# Patient Record
Sex: Male | Born: 1945 | Race: Black or African American | Hispanic: No | Marital: Single | Smoking: Former smoker
Health system: Southern US, Community
[De-identification: ages and names within clinical notes are randomized; demographics above are authoritative.]

## PROBLEM LIST (undated history)

## (undated) DIAGNOSIS — I10 Essential (primary) hypertension: Secondary | ICD-10-CM

## (undated) DIAGNOSIS — R000 Tachycardia, unspecified: Secondary | ICD-10-CM

## (undated) DIAGNOSIS — Z923 Personal history of irradiation: Secondary | ICD-10-CM

## (undated) DIAGNOSIS — I214 Non-ST elevation (NSTEMI) myocardial infarction: Secondary | ICD-10-CM

## (undated) DIAGNOSIS — R002 Palpitations: Secondary | ICD-10-CM

## (undated) DIAGNOSIS — Z9289 Personal history of other medical treatment: Secondary | ICD-10-CM

## (undated) DIAGNOSIS — J449 Chronic obstructive pulmonary disease, unspecified: Secondary | ICD-10-CM

## (undated) DIAGNOSIS — C3490 Malignant neoplasm of unspecified part of unspecified bronchus or lung: Secondary | ICD-10-CM

## (undated) DIAGNOSIS — R0602 Shortness of breath: Secondary | ICD-10-CM

## (undated) DIAGNOSIS — R0902 Hypoxemia: Secondary | ICD-10-CM

## (undated) HISTORY — DX: Chronic obstructive pulmonary disease, unspecified: J44.9

## (undated) HISTORY — DX: Hypoxemia: R09.02

## (undated) HISTORY — DX: Tachycardia, unspecified: R00.0

## (undated) HISTORY — DX: Non-ST elevation (NSTEMI) myocardial infarction: I21.4

## (undated) HISTORY — PX: OTHER SURGICAL HISTORY: SHX169

## (undated) HISTORY — DX: Palpitations: R00.2

## (undated) HISTORY — DX: Essential (primary) hypertension: I10

---

## 2011-05-25 DIAGNOSIS — Z9289 Personal history of other medical treatment: Secondary | ICD-10-CM

## 2011-05-25 HISTORY — DX: Personal history of other medical treatment: Z92.89

## 2012-05-27 DIAGNOSIS — R0602 Shortness of breath: Secondary | ICD-10-CM

## 2012-07-23 ENCOUNTER — Encounter: Payer: Self-pay | Admitting: Physician Assistant

## 2012-07-24 ENCOUNTER — Encounter: Payer: Self-pay | Admitting: Physician Assistant

## 2012-07-24 DIAGNOSIS — I214 Non-ST elevation (NSTEMI) myocardial infarction: Secondary | ICD-10-CM

## 2012-07-24 DIAGNOSIS — I471 Supraventricular tachycardia: Secondary | ICD-10-CM

## 2012-07-24 DIAGNOSIS — R7989 Other specified abnormal findings of blood chemistry: Secondary | ICD-10-CM

## 2012-07-26 ENCOUNTER — Other Ambulatory Visit: Payer: Self-pay | Admitting: *Deleted

## 2012-07-26 DIAGNOSIS — R000 Tachycardia, unspecified: Secondary | ICD-10-CM

## 2012-07-31 DIAGNOSIS — I2510 Atherosclerotic heart disease of native coronary artery without angina pectoris: Secondary | ICD-10-CM

## 2012-08-02 DIAGNOSIS — R000 Tachycardia, unspecified: Secondary | ICD-10-CM

## 2012-08-04 ENCOUNTER — Encounter: Payer: Self-pay | Admitting: Cardiology

## 2012-08-11 ENCOUNTER — Telehealth: Payer: Self-pay | Admitting: *Deleted

## 2012-08-11 NOTE — Telephone Encounter (Signed)
Message copied by Eustace Moore on Fri Aug 11, 2012  4:53 PM ------      Message from: Jonelle Sidle      Created: Fri Aug 04, 2012 11:31 AM       Reviewed report. Overall low risk study, no clear evidence of ischemia. Please ensure that the patient has a scheduled office visit. He was seen as an inpatient consult, should also have an event monitor already arranged. Please pull the cardiology consultation from [facility] and have it scanned into EPIC. ------

## 2012-08-11 NOTE — Telephone Encounter (Signed)
Patient informed. 

## 2012-08-16 ENCOUNTER — Encounter: Payer: Self-pay | Admitting: Cardiology

## 2012-08-23 ENCOUNTER — Ambulatory Visit (INDEPENDENT_AMBULATORY_CARE_PROVIDER_SITE_OTHER): Payer: Medicare PPO | Admitting: Physician Assistant

## 2012-08-23 ENCOUNTER — Encounter: Payer: Self-pay | Admitting: Physician Assistant

## 2012-08-23 VITALS — BP 134/80 | HR 88 | Ht 68.0 in | Wt 144.4 lb

## 2012-08-23 DIAGNOSIS — I10 Essential (primary) hypertension: Secondary | ICD-10-CM

## 2012-08-23 DIAGNOSIS — I214 Non-ST elevation (NSTEMI) myocardial infarction: Secondary | ICD-10-CM | POA: Insufficient documentation

## 2012-08-23 DIAGNOSIS — I498 Other specified cardiac arrhythmias: Secondary | ICD-10-CM

## 2012-08-23 DIAGNOSIS — R222 Localized swelling, mass and lump, trunk: Secondary | ICD-10-CM

## 2012-08-23 DIAGNOSIS — R000 Tachycardia, unspecified: Secondary | ICD-10-CM | POA: Insufficient documentation

## 2012-08-23 DIAGNOSIS — R918 Other nonspecific abnormal finding of lung field: Secondary | ICD-10-CM

## 2012-08-23 MED ORDER — METOPROLOL TARTRATE 25 MG PO TABS: 25.0000 mg | ORAL_TABLET | Freq: Two times a day (BID) | ORAL | Status: DC

## 2012-08-23 NOTE — Assessment & Plan Note (Signed)
Stable on current medication therapy, which includes recent addition of both amlodipine and Lopressor.

## 2012-08-23 NOTE — Patient Instructions (Signed)
Continue all current medications. Follow up in  3 months 

## 2012-08-23 NOTE — Assessment & Plan Note (Signed)
Quiescent following addition of beta blocker therapy. No recurrent symptoms. No definite evidence of dysrhythmia by recent event monitoring. Continue to monitor clinically.

## 2012-08-23 NOTE — Progress Notes (Signed)
Primary Cardiologist: Simona Huh, MD (new)   HPI: Post hospital followup from [facility], following presentation with SVT. He presented with no known history of heart disease, or prior ischemic evaluation.  She presented to the ED with HR 147, and was treated with 6 mg IV adenosine. Following admission, he demonstrated persistent sinus tachycardia, with no definite evidence of a dysrhythmia. Of note, he presented with complaint of long-standing palpitations.  Cardiac markers suggested NST EMI (type II), in setting of persistent ST. He was also diagnosed with new onset HTN. TSH NL. An echocardiogram was obtained, as follows:   - Echocardiogram, March 3: EF 50-55%, diastolic dysfunction, inferobasal HK; no significant valvular abnormalities  We started patient on ASA and low dose Lopressor, and recommended further evaluation as an outpatient with a 21 day event monitor and an exercise stress Cardiolite study, for risk stratification.   - Event monitor: NSR, no definite dysrhythmia   - Lexiscan Cardiolite, March 10: Question of very small area of scar in inferior septal segment; no definite ischemia; EF 52%; representing a low risk scan  Clinically, he denies any recurrent tachycardia palpitations. He continues to report no exertional CP.  No Known Allergies  Current Outpatient Prescriptions  Medication Sig Dispense Refill  . amLODipine (NORVASC) 5 MG tablet Take 5 mg by mouth daily.       Marland Kitchen aspirin 81 MG chewable tablet Chew 81 mg by mouth daily.      . budesonide-formoterol (SYMBICORT) 160-4.5 MCG/ACT inhaler Inhale 2 puffs into the lungs 2 (two) times daily.      Marland Kitchen ipratropium-albuterol (DUONEB) 0.5-2.5 (3) MG/3ML SOLN Take 3 mLs by nebulization every 4 (four) hours.      . metoprolol tartrate (LOPRESSOR) 25 MG tablet Take 25 mg by mouth daily.       . montelukast (SINGULAIR) 10 MG tablet Take 10 mg by mouth at bedtime.      Docia Barrier IN Inhale into the lungs at bedtime as needed.       . theophylline (THEODUR) 200 MG 12 hr tablet Take 200 mg by mouth 2 (two) times daily.      . VENTOLIN HFA 108 (90 BASE) MCG/ACT inhaler Inhale 2 puffs into the lungs every 6 (six) hours as needed.        No current facility-administered medications for this visit.    Past Medical History  Diagnosis Date  . COPD (chronic obstructive pulmonary disease)     History of tobacco use  . Hypoxic     History of hypoxic respiratory failure  . Sinus tachycardia   . Palpitations   . Non-ST elevated myocardial infarction (non-STEMI)   . Hypertension     No past surgical history on file.  History   Social History  . Marital Status: Single    Spouse Name: N/A    Number of Children: N/A  . Years of Education: N/A   Occupational History  . Not on file.   Social History Main Topics  . Smoking status: Former Smoker -- 1.00 packs/day for 30 years    Types: Cigarettes    Quit date: 05/24/2006  . Smokeless tobacco: Not on file  . Alcohol Use: No  . Drug Use: Not on file  . Sexually Active: Not on file   Other Topics Concern  . Not on file   Social History Narrative  . No narrative on file    Family History  Problem Relation Age of Onset  . Heart attack Brother  cause of death at age 37  . Heart attack Brother     cause of death at age 5    ROS: no nausea, vomiting; no fever, chills; no melena, hematochezia; no claudication  PHYSICAL EXAM: BP 134/80  Pulse 88  Ht 5\' 8"  (1.727 m)  Wt 144 lb 6.4 oz (65.499 kg)  BMI 21.96 kg/m2 GENERAL: 67 year old male; NAD HEENT: NCAT, PERRLA, EOMI; sclera clear; no xanthelasma; nasal cannula NECK: palpable bilateral carotid pulses, no bruits; no JVD; no TM LUNGS: Diminished breath sounds, crackles/wheezes CARDIAC: RRR (S1, S2); no significant murmurs; no rubs or gallops ABDOMEN: soft, non-tender; intact BS EXTREMETIES: intact distal pulses; no significant peripheral edema SKIN: warm/dry; no obvious rash/lesions MUSCULOSKELETAL:  no joint deformity NEURO: no focal deficit; NL affect   EKG:    ASSESSMENT & PLAN:  Sinus tachycardia Quiescent following addition of beta blocker therapy. No recurrent symptoms. No definite evidence of dysrhythmia by recent event monitoring. Continue to monitor clinically.  Non-ST elevated myocardial infarction (non-STEMI) No further workup currently indicated. Patient had minimal elevation of troponins (0.08) in the setting of tachycardia. He denies any history of exertional CP. His recent nuclear imaging study was deemed a low risk scan, by Dr. Myrtis Ser, with no definite evidence of ischemia, and question of very small area of scar in the inferoseptal region. Continue to monitor clinically on low-dose ASA and beta blocker. Of note, no current indication to add a statin, given HDL 75.   Pulmonary mass Patient is scheduled to follow up with Dr. Cherie Ouch.  Hypertension Stable on current medication therapy, which includes recent addition of both amlodipine and Lopressor.    Gene Syana Degraffenreid, PAC

## 2012-08-23 NOTE — Assessment & Plan Note (Signed)
Patient is scheduled to follow up with Dr. Cherie Ouch.

## 2012-08-23 NOTE — Assessment & Plan Note (Signed)
No further workup currently indicated. Patient had minimal elevation of troponins (0.08) in the setting of tachycardia. He denies any history of exertional CP. His recent nuclear imaging study was deemed a low risk scan, by Dr. Myrtis Ser, with no definite evidence of ischemia, and question of very small area of scar in the inferoseptal region. Continue to monitor clinically on low-dose ASA and beta blocker. Of note, no current indication to add a statin, given HDL 75.

## 2012-09-22 ENCOUNTER — Telehealth: Payer: Self-pay | Admitting: *Deleted

## 2012-09-22 MED ORDER — NITROGLYCERIN 0.4 MG SL SUBL: 0.4000 mg | SUBLINGUAL_TABLET | SUBLINGUAL | Status: AC | PRN

## 2012-09-22 NOTE — Telephone Encounter (Signed)
Chart reviewed. Recommend adding NTG 0.4 mg prn for CP, and to notify our office if sxs worsen, or do not respond to NTG.

## 2012-09-22 NOTE — Telephone Encounter (Signed)
Patient notified.  Will send new rx to [facility] [place].  Informed patient on proper usage of the Nitroglycerin and to go to ED if necessary.  Patient verbalized understanding.

## 2012-09-22 NOTE — Telephone Encounter (Signed)
HH nurse called from patient home this morning.  States patient has been c/o intermittant chest pain (0-5) x last 3 days.  Vitals are stable with BP of 124/86.  No other c/o any other symptoms.  Does not have NTG at home.  Also, is not c/o of any active chest pain now.  Does have OV to see Dr. Orson Aloe next week.  Advised [facility] nurse that info will be sent to PA for advice.  Patient last seen 4/2.

## 2013-03-12 ENCOUNTER — Encounter (HOSPITAL_COMMUNITY): Payer: Self-pay | Admitting: Pharmacy Technician

## 2013-03-15 ENCOUNTER — Encounter (HOSPITAL_COMMUNITY)
Admission: RE | Admit: 2013-03-15 | Discharge: 2013-03-15 | Disposition: A | Discharge status: Home / Self Care | Payer: Medicare PPO | Source: Ambulatory Visit | Attending: Oral Surgery | Admitting: Oral Surgery

## 2013-03-15 ENCOUNTER — Encounter (HOSPITAL_COMMUNITY): Payer: Self-pay

## 2013-03-15 ENCOUNTER — Other Ambulatory Visit (HOSPITAL_COMMUNITY): Payer: Self-pay | Admitting: *Deleted

## 2013-03-15 DIAGNOSIS — Z01818 Encounter for other preprocedural examination: Secondary | ICD-10-CM | POA: Insufficient documentation

## 2013-03-15 DIAGNOSIS — Z01812 Encounter for preprocedural laboratory examination: Secondary | ICD-10-CM | POA: Insufficient documentation

## 2013-03-15 HISTORY — DX: Personal history of other medical treatment: Z92.89

## 2013-03-15 HISTORY — DX: Shortness of breath: R06.02

## 2013-03-15 LAB — BASIC METABOLIC PANEL
BUN: 9 mg/dL (ref 6–23)
CO2: 26 mEq/L (ref 19–32)
Calcium: 10 mg/dL (ref 8.4–10.5)
Glucose, Bld: 100 mg/dL — ABNORMAL HIGH (ref 70–99)
Potassium: 4 mEq/L (ref 3.5–5.1)
Sodium: 138 mEq/L (ref 135–145)

## 2013-03-15 LAB — CBC
Hemoglobin: 15.5 g/dL (ref 13.0–17.0)
MCH: 33 pg (ref 26.0–34.0)
MCHC: 35.5 g/dL (ref 30.0–36.0)
MCV: 93 fL (ref 78.0–100.0)
RBC: 4.7 MIL/uL (ref 4.22–5.81)

## 2013-03-15 NOTE — H&P (Signed)
HISTORY AND PHYSICAL  Daniel Hooper is a 67 y.o. male patient with CC: painful teeth  No diagnosis found.  Past Medical History  Diagnosis Date  . Hypoxic     History of hypoxic respiratory failure  . Sinus tachycardia   . Palpitations   . Non-ST elevated myocardial infarction (non-STEMI)   . Hypertension   . COPD (chronic obstructive pulmonary disease)     History of tobacco use, O2 - 2 liters per min., uses on & off   . Shortness of breath   . H/O echocardiogram 2013    No current facility-administered medications for this encounter.   Current Outpatient Prescriptions  Medication Sig Dispense Refill  . amLODipine (NORVASC) 5 MG tablet Take 5 mg by mouth daily before breakfast.       . aspirin 81 MG chewable tablet Chew 81 mg by mouth daily.      . budesonide-formoterol (SYMBICORT) 160-4.5 MCG/ACT inhaler Inhale 2 puffs into the lungs 2 (two) times daily.      Marland Kitchen ipratropium-albuterol (DUONEB) 0.5-2.5 (3) MG/3ML SOLN Take 3 mLs by nebulization every 6 (six) hours.       . montelukast (SINGULAIR) 10 MG tablet Take 10 mg by mouth at bedtime.      . nitroGLYCERIN (NITROSTAT) 0.4 MG SL tablet Place 1 tablet (0.4 mg total) under the tongue every 5 (five) minutes as needed for chest pain. (Not more than 3 in 15 minute time frame, if no relief - proceed to ED)  25 tablet  3  . theophylline (THEODUR) 200 MG 12 hr tablet Take 200 mg by mouth 2 (two) times daily.      . VENTOLIN HFA 108 (90 BASE) MCG/ACT inhaler Inhale 2 puffs into the lungs every 6 (six) hours as needed for shortness of breath.       Docia Barrier IN Inhale into the lungs at bedtime as needed.       No Known Allergies Active Problems:   * No active hospital problems. *  Vitals: There were no vitals taken for this visit. Lab results: Results for orders placed during the hospital encounter of 03/15/13 (from the past 24 hour(s))  BASIC METABOLIC PANEL     Status: Abnormal   Collection Time    03/15/13 12:58 PM   Result Value Range   Sodium 138  135 - 145 mEq/L   Potassium 4.0  3.5 - 5.1 mEq/L   Chloride 98  96 - 112 mEq/L   CO2 26  19 - 32 mEq/L   Glucose, Bld 100 (*) 70 - 99 mg/dL   BUN 9  6 - 23 mg/dL   Creatinine, Ser 4.09  0.50 - 1.35 mg/dL   Calcium 81.1  8.4 - 91.4 mg/dL   GFR calc non Af Amer >90  >90 mL/min   GFR calc Af Amer >90  >90 mL/min  CBC     Status: None   Collection Time    03/15/13 12:58 PM      Result Value Range   WBC 7.8  4.0 - 10.5 K/uL   RBC 4.70  4.22 - 5.81 MIL/uL   Hemoglobin 15.5  13.0 - 17.0 g/dL   HCT 78.2  95.6 - 21.3 %   MCV 93.0  78.0 - 100.0 fL   MCH 33.0  26.0 - 34.0 pg   MCHC 35.5  30.0 - 36.0 g/dL   RDW 08.6  57.8 - 46.9 %   Platelets 278  150 - 400 K/uL  Radiology Results: Dg Chest 2 View  03/15/2013   CLINICAL DATA:  Hypertension, preop.  EXAM: CHEST  2 VIEW  COMPARISON:  07/23/2012  FINDINGS: There is hyperinflation of the lungs compatible with COPD. scarring in the right upper lobe peripherally, stable. Left lung is clear. Heart is normal size. No effusions or acute bony abnormality.  IMPRESSION: COPD/chronic changes. No active disease.   Electronically Signed   By: Charlett Nose M.D.   On: 03/15/2013 14:14   General appearance: alert, cooperative and no distress Head: Normocephalic, without obvious abnormality, atraumatic Eyes: negative Ears: normal TM's and external ear canals both ears Nose: Nares normal. Septum midline. Mucosa normal. No drainage or sinus tenderness. Throat: dental caries teeth # 8, 9, 11, 13, 14, 15, 16, 18, 19, 20, 21, 23, 24, 25, 26, 27, 28, 29, 30, bilateral mandibular tori. Neck: no adenopathy, no carotid bruit, supple, symmetrical, trachea midline and thyroid not enlarged, symmetric, no tenderness/mass/nodules Resp: clear to auscultation bilaterally Cardio: regular rate and rhythm, S1, S2 normal, no murmur, click, rub or gallop  Assessment: Non-restorableteeth # 8, 9, 11, 13, 14, 15, 16, 18, 19, 20, 21, 23, 24, 25, 26,  27, 28, 29, 30, bilateral mandibular tori.   Plan:Extraction teeth # 8, 9, 11, 13, 14, 15, 16, 18, 19, 20, 21, 23, 24, 25, 26, 27, 28, 29, 30, alveoloplasty, removal  bilateral mandibular tori. General anesthesia, day surgery.   Daniel Hooper 03/15/2013

## 2013-03-15 NOTE — Pre-Procedure Instructions (Signed)
Daniel Hooper  03/15/2013   Your procedure is scheduled on:  Tuesday, March 20, 2013 at 11:30 AM.   Report to [facility] Entrance "A" at 8:30 AM.   Call this number if you have problems the morning of surgery: [phone]   Remember:   Do not eat food or drink liquids after midnight Monday, 03/19/13.   Take these medicines the morning of surgery with A SIP OF WATER: amLODipine (NORVASC), budesonide-formoterol (SYMBICORT) inhaler, VENTOLIN HFA 108 (90 BASE) inhaler - if needed, nitroGLYCERIN (NITROSTAT) - if needed.  Stop all Vitamins, Herbal Medications, Aspirin and NSAIDS as of today, 03/15/13.       Do not wear jewelry.  Do not wear lotions, powders, or cologne. You may wear deodorant.  Do not shave 48 hours prior to surgery. Men may shave face and neck.  Do not bring valuables to the hospital.  [facility] is not responsible                  for any belongings or valuables.               Contacts, dentures or bridgework may not be worn into surgery.  Leave suitcase in the car. After surgery it may be brought to your room.  For patients admitted to the hospital, discharge time is determined by your                treatment team.               Patients discharged the day of surgery will not be allowed to drive  home.  Name and phone number of your driver: Family/friend   Special Instructions: Shower using CHG 2 nights before surgery and the night before surgery.  If you shower the day of surgery use CHG.  Use special wash - you have one bottle of CHG for all showers.  You should use approximately 1/3 of the bottle for each shower.   Please read over the following fact sheets that you were given: Pain Booklet, Coughing and Deep Breathing and Surgical Site Infection Prevention

## 2013-03-15 NOTE — Progress Notes (Signed)
Call to Dr. Barbette Merino office, spoke with Ocie Cornfield, requested medical clearance fr. Pulm./internal med. Review.

## 2013-03-16 NOTE — Progress Notes (Signed)
Anesthesia Chart Review:  Patient is a 67 year old male scheduled for multiple teeth extractions on 03/20/13 by Dr. Barbette Merino.  History includes former smoker, NSTEMI (minimal elevation of troponin at 0.08 in the setting of tachycardia/SVT--demand ischemia) with subsequent echo and low risk stress test 07/2012 and no further cardiac testing was felt indicated at that time, HTN, COPD with PRN O2 @ 2L/[place], RUL lung mass with 01/12/2013 pulmonology notes mention consideration of referral to Dr. Tyrone Sage for resection. Cardiologist is Dr. Diona Browner with last visit with Gene Serpe, PA-C 08/23/2012.  Pulmonologist is Dr. Cherie Ouch who cleared patient for this procedure from a pulmonary standpoint following a visit on 01/12/13.    EKG on 07/24/12 showed SR, abnormal R wave progression, early transition, minimal anterior ST elevation.  Echo on 07/24/12 showed mild concentric LVH, global LV wall motion and contractility are WNL, EF 50-55%, abnormal LV diastolic filling is observed, consistent with impaired relaxation.  The basal inferior wall segment is hypokinetic (score 2). Trace MR/TR.  Nuclear stress test on 07/31/12 showed a question of a very small area of scar with no ischemia in the inferior septal segment, EF 52%.  Ultimately, felt to be a low risk scan.     Cardiac event monitor read on 08/23/12 showed SR with no definite arrythmia.   CXR on 03/15/13 showed COPD/chronic changes. No active disease.   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   Preoperative labs noted.  I will request PFT's from Dr. Orson Aloe and more recent records (if any).  Patient was cleared by pulmonology.  He has had cardiac evaluation approximately six months ago with stress and echo earlier this year with no additional testing recommended at that time.  He will  be further evaluated by his assigned anesthesiologist, but if no acute changes in his cardiopulmonary status then I would anticipate that he could proceed as planned.  Velna Ochs [facility] Short Stay Center/Anesthesiology Phone [phone] 03/16/2013 2:00 PM

## 2013-03-19 MED ORDER — CEFAZOLIN SODIUM-DEXTROSE 2-3 GM-% IV SOLR
2.0000 g | INTRAVENOUS | Status: AC
  Administered 2013-03-20: 2 g via INTRAVENOUS
  Filled 2013-03-19: qty 50

## 2013-03-19 NOTE — Progress Notes (Signed)
Pt notified of time change;to arrive at 0730

## 2013-03-20 ENCOUNTER — Encounter (HOSPITAL_COMMUNITY): Payer: Medicare PPO | Admitting: Vascular Surgery

## 2013-03-20 ENCOUNTER — Encounter (HOSPITAL_COMMUNITY)
Admission: RE | Disposition: A | Discharge status: Home / Self Care | Payer: Self-pay | Source: Ambulatory Visit | Attending: Oral Surgery

## 2013-03-20 ENCOUNTER — Encounter (HOSPITAL_COMMUNITY): Payer: Self-pay

## 2013-03-20 ENCOUNTER — Ambulatory Visit (HOSPITAL_COMMUNITY): Payer: Medicare PPO | Admitting: Anesthesiology

## 2013-03-20 ENCOUNTER — Ambulatory Visit (HOSPITAL_COMMUNITY)
Admission: RE | Admit: 2013-03-20 | Discharge: 2013-03-20 | Disposition: A | Discharge status: Home / Self Care | Payer: Medicare PPO | Source: Ambulatory Visit | Attending: Oral Surgery | Admitting: Oral Surgery

## 2013-03-20 DIAGNOSIS — R918 Other nonspecific abnormal finding of lung field: Secondary | ICD-10-CM

## 2013-03-20 DIAGNOSIS — R000 Tachycardia, unspecified: Secondary | ICD-10-CM

## 2013-03-20 DIAGNOSIS — Z7982 Long term (current) use of aspirin: Secondary | ICD-10-CM | POA: Insufficient documentation

## 2013-03-20 DIAGNOSIS — Z79899 Other long term (current) drug therapy: Secondary | ICD-10-CM | POA: Insufficient documentation

## 2013-03-20 DIAGNOSIS — J4489 Other specified chronic obstructive pulmonary disease: Secondary | ICD-10-CM | POA: Insufficient documentation

## 2013-03-20 DIAGNOSIS — M278 Other specified diseases of jaws: Secondary | ICD-10-CM | POA: Insufficient documentation

## 2013-03-20 DIAGNOSIS — I252 Old myocardial infarction: Secondary | ICD-10-CM | POA: Insufficient documentation

## 2013-03-20 DIAGNOSIS — I10 Essential (primary) hypertension: Secondary | ICD-10-CM

## 2013-03-20 DIAGNOSIS — I498 Other specified cardiac arrhythmias: Secondary | ICD-10-CM | POA: Insufficient documentation

## 2013-03-20 DIAGNOSIS — I214 Non-ST elevation (NSTEMI) myocardial infarction: Secondary | ICD-10-CM

## 2013-03-20 DIAGNOSIS — K0530 Chronic periodontitis, unspecified: Secondary | ICD-10-CM

## 2013-03-20 DIAGNOSIS — K029 Dental caries, unspecified: Secondary | ICD-10-CM

## 2013-03-20 DIAGNOSIS — J449 Chronic obstructive pulmonary disease, unspecified: Secondary | ICD-10-CM | POA: Insufficient documentation

## 2013-03-20 DIAGNOSIS — R002 Palpitations: Secondary | ICD-10-CM | POA: Insufficient documentation

## 2013-03-20 HISTORY — PX: MULTIPLE EXTRACTIONS WITH ALVEOLOPLASTY: SHX5342

## 2013-03-20 SURGERY — MULTIPLE EXTRACTION WITH ALVEOLOPLASTY
Anesthesia: General | Site: Mouth | Wound class: Clean Contaminated

## 2013-03-20 MED ORDER — SUCCINYLCHOLINE CHLORIDE 20 MG/ML IJ SOLN
INTRAMUSCULAR | Status: DC | PRN
  Administered 2013-03-20: 80 mg via INTRAVENOUS

## 2013-03-20 MED ORDER — OXYMETAZOLINE HCL 0.05 % NA SOLN
NASAL | Status: DC | PRN
  Administered 2013-03-20: 1 via NASAL

## 2013-03-20 MED ORDER — ONDANSETRON HCL 4 MG/2ML IJ SOLN: 4.0000 mg | Freq: Once | INTRAMUSCULAR | Status: DC | PRN

## 2013-03-20 MED ORDER — HYDROMORPHONE HCL PF 1 MG/ML IJ SOLN
INTRAMUSCULAR | Status: AC
  Filled 2013-03-20: qty 1

## 2013-03-20 MED ORDER — LIDOCAINE HCL (CARDIAC) 20 MG/ML IV SOLN
INTRAVENOUS | Status: DC | PRN
  Administered 2013-03-20: 40 mg via INTRAVENOUS

## 2013-03-20 MED ORDER — PROPOFOL 10 MG/ML IV BOLUS
INTRAVENOUS | Status: DC | PRN
  Administered 2013-03-20: 30 mg via INTRAVENOUS
  Administered 2013-03-20: 170 mg via INTRAVENOUS

## 2013-03-20 MED ORDER — LIDOCAINE-EPINEPHRINE 2 %-1:100000 IJ SOLN
INTRAMUSCULAR | Status: DC | PRN
  Administered 2013-03-20: 16 mL

## 2013-03-20 MED ORDER — SODIUM CHLORIDE 0.9 % IR SOLN
Status: DC | PRN
  Administered 2013-03-20: 1000 mL

## 2013-03-20 MED ORDER — ONDANSETRON HCL 4 MG/2ML IJ SOLN
INTRAMUSCULAR | Status: DC | PRN
  Administered 2013-03-20: 4 mg via INTRAVENOUS

## 2013-03-20 MED ORDER — LACTATED RINGERS IV SOLN
INTRAVENOUS | Status: DC
  Administered 2013-03-20: 10:00:00 via INTRAVENOUS

## 2013-03-20 MED ORDER — ARTIFICIAL TEARS OP OINT
TOPICAL_OINTMENT | OPHTHALMIC | Status: DC | PRN
  Administered 2013-03-20: 1 via OPHTHALMIC

## 2013-03-20 MED ORDER — OXYCODONE-ACETAMINOPHEN 5-325 MG PO TABS: 1.0000 | ORAL_TABLET | ORAL | Status: DC | PRN

## 2013-03-20 MED ORDER — FENTANYL CITRATE 0.05 MG/ML IJ SOLN
INTRAMUSCULAR | Status: DC | PRN
  Administered 2013-03-20: 100 ug via INTRAVENOUS
  Administered 2013-03-20 (×2): 50 ug via INTRAVENOUS

## 2013-03-20 MED ORDER — HYDROMORPHONE HCL PF 1 MG/ML IJ SOLN
0.2500 mg | INTRAMUSCULAR | Status: DC | PRN
  Administered 2013-03-20: 0.5 mg via INTRAVENOUS

## 2013-03-20 MED ORDER — LIDOCAINE-EPINEPHRINE 2 %-1:100000 IJ SOLN
INTRAMUSCULAR | Status: AC
  Filled 2013-03-20: qty 1

## 2013-03-20 MED ORDER — MIDAZOLAM HCL 5 MG/5ML IJ SOLN
INTRAMUSCULAR | Status: DC | PRN
  Administered 2013-03-20: 2 mg via INTRAVENOUS

## 2013-03-20 SURGICAL SUPPLY — 27 items
BUR CROSS CUT FISSURE 1.6 (BURR) ×2 IMPLANT
BUR EGG ELITE 4.0 (BURR) ×2 IMPLANT
CANISTER SUCTION 2500CC (MISCELLANEOUS) ×2 IMPLANT
CLOTH BEACON ORANGE TIMEOUT ST (SAFETY) ×2 IMPLANT
COVER SURGICAL LIGHT HANDLE (MISCELLANEOUS) ×2 IMPLANT
CRADLE DONUT ADULT HEAD (MISCELLANEOUS) ×2 IMPLANT
DECANTER SPIKE VIAL GLASS SM (MISCELLANEOUS) ×2 IMPLANT
GAUZE PACKING FOLDED 2  STR (GAUZE/BANDAGES/DRESSINGS) ×1
GAUZE PACKING FOLDED 2 STR (GAUZE/BANDAGES/DRESSINGS) ×1 IMPLANT
GLOVE BIO SURGEON STRL SZ 6.5 (GLOVE) ×2 IMPLANT
GLOVE BIO SURGEON STRL SZ7.5 (GLOVE) ×2 IMPLANT
GLOVE BIOGEL PI IND STRL 7.0 (GLOVE) ×1 IMPLANT
GLOVE BIOGEL PI INDICATOR 7.0 (GLOVE) ×1
GOWN STRL NON-REIN LRG LVL3 (GOWN DISPOSABLE) ×2 IMPLANT
GOWN STRL REIN XL XLG (GOWN DISPOSABLE) ×2 IMPLANT
KIT BASIN OR (CUSTOM PROCEDURE TRAY) ×2 IMPLANT
KIT ROOM TURNOVER OR (KITS) ×2 IMPLANT
NEEDLE 22X1 1/2 (OR ONLY) (NEEDLE) ×2 IMPLANT
NS IRRIG 1000ML POUR BTL (IV SOLUTION) ×2 IMPLANT
PAD ARMBOARD 7.5X6 YLW CONV (MISCELLANEOUS) ×4 IMPLANT
SUT CHROMIC 3 0 PS 2 (SUTURE) ×2 IMPLANT
SYR CONTROL 10ML LL (SYRINGE) ×2 IMPLANT
TOWEL OR 17X26 10 PK STRL BLUE (TOWEL DISPOSABLE) ×2 IMPLANT
TRAY ENT MC OR (CUSTOM PROCEDURE TRAY) ×2 IMPLANT
TUBING IRRIGATION (MISCELLANEOUS) ×2 IMPLANT
WATER STERILE IRR 1000ML POUR (IV SOLUTION) IMPLANT
YANKAUER SUCT BULB TIP NO VENT (SUCTIONS) ×2 IMPLANT

## 2013-03-20 NOTE — Anesthesia Procedure Notes (Signed)
Procedure Name: Intubation Date/Time: 03/20/2013 11:47 AM Performed by: De Nurse Pre-anesthesia Checklist: Patient identified, Emergency Drugs available, Suction available, Patient being monitored and Timeout performed Patient Re-evaluated:Patient Re-evaluated prior to inductionOxygen Delivery Method: Circle system utilized Preoxygenation: Pre-oxygenation with 100% oxygen Intubation Type: IV induction and Rapid sequence Laryngoscope Size: Mac and 4 Grade View: Grade I Nasal Tubes: Right, Nasal Rae and Nasal prep performed Tube size: 7.0 mm Number of attempts: 1 Placement Confirmation: ETT inserted through vocal cords under direct vision,  positive ETCO2 and breath sounds checked- equal and bilateral Secured at: 28 cm Tube secured with: Tape Dental Injury: Teeth and Oropharynx as per pre-operative assessment

## 2013-03-20 NOTE — Preoperative (Signed)
Beta Blockers   Reason not to administer Beta Blockers:Not Applicable 

## 2013-03-20 NOTE — Anesthesia Postprocedure Evaluation (Signed)
  Anesthesia Post-op Note  Patient: Daniel Hooper  Procedure(s) Performed: Procedure(s): MULTIPLE EXTRACION 8, 9, 11, 13, 14, 15, 16, 18, 19, 20, 21, 23, 24, 25, 26, 27, 28, 29, 30 WITH ALVEOLOPLASTY X 4, BILATERAL TORI (N/A)  Patient Location: PACU  Anesthesia Type:General  Level of Consciousness: awake, alert  and oriented  Airway and Oxygen Therapy: Patient Spontanous Breathing and Patient connected to nasal cannula oxygen  Post-op Pain: mild  Post-op Assessment: Post-op Vital signs reviewed, Patient's Cardiovascular Status Stable, Respiratory Function Stable, Patent Airway and Pain level controlled  Post-op Vital Signs: stable  Complications: No apparent anesthesia complications

## 2013-03-20 NOTE — Transfer of Care (Signed)
Immediate Anesthesia Transfer of Care Note  Patient: Daniel Hooper  Procedure(s) Performed: Procedure(s): MULTIPLE EXTRACION 8, 9, 11, 13, 14, 15, 16, 18, 19, 20, 21, 23, 24, 25, 26, 27, 28, 29, 30 WITH ALVEOLOPLASTY X 4, BILATERAL TORI (N/A)  Patient Location: PACU  Anesthesia Type:General  Level of Consciousness: awake, alert  and oriented  Airway & Oxygen Therapy: Patient Spontanous Breathing and Patient connected to face mask oxygen  Post-op Assessment: Report given to PACU RN  Post vital signs: Reviewed and stable  Complications: No apparent anesthesia complications

## 2013-03-20 NOTE — Op Note (Signed)
03/20/2013  12:31 PM  PATIENT:  Assunta Curtis  67 y.o. male  PRE-OPERATIVE DIAGNOSIS:  NON RESTORABLE TEETH 8, 9, 11, 13, 14, 15, 16, 18, 19, 20, 21, 23, 24, 25, 26, 27, 28, 29, 30 , BILATERAL mandibular lingual TORI  POST-OPERATIVE DIAGNOSIS:  SAME  PROCEDURE:  Procedure(s): MULTIPLE EXTRACION 8, 9, 11, 13, 14, 15, 16, 18, 19, 20, 21, 23, 24, 25, 26, 27, 28, 29, 30 WITH ALVEOLOPLASTY X 4, REMOVAL BILATERAL MANDIBULAR LINGUAL TORI  SURGEON:  Surgeon(s): Georgia Lopes, DDS  ANESTHESIA:   local and general  EBL:  minimal  DRAINS: none   SPECIMEN:  No Specimen  COUNTS:  YES  PLAN OF CARE: Discharge to home after PACU  PATIENT DISPOSITION:  PACU - hemodynamically stable.   PROCEDURE DETAILS: Dictation # [id number]  Georgia Lopes, DMD 03/20/2013 12:31 PM

## 2013-03-20 NOTE — H&P (Signed)
H&P documentation  -History and Physical Reviewed  -Patient has been re-examined  -No change in the plan of care  Daniel Hooper M  

## 2013-03-20 NOTE — Anesthesia Preprocedure Evaluation (Addendum)
Anesthesia Evaluation  Patient identified by MRN, date of birth, ID band Patient awake    Reviewed: Allergy & Precautions, H&P , NPO status , Patient's Chart, lab work & pertinent test results  Airway Mallampati: II TM Distance: >3 FB Neck ROM: Full    Dental  (+) Poor Dentition   Pulmonary  breath sounds clear to auscultation  + decreased breath sounds      Cardiovascular Rhythm:Regular Rate:Normal     Neuro/Psych    GI/Hepatic   Endo/Other    Renal/GU      Musculoskeletal   Abdominal   Peds  Hematology   Anesthesia Other Findings   Reproductive/Obstetrics                           Anesthesia Physical Anesthesia Plan  ASA: III  Anesthesia Plan: General   Post-op Pain Management:    Induction: Intravenous  Airway Management Planned: Nasal ETT  Additional Equipment:   Intra-op Plan:   Post-operative Plan: Extubation in OR  Informed Consent: I have reviewed the patients History and Physical, chart, labs and discussed the procedure including the risks, benefits and alternatives for the proposed anesthesia with the patient or authorized representative who has indicated his/her understanding and acceptance.     Plan Discussed with: CRNA and Anesthesiologist  Anesthesia Plan Comments: (Poor dentition COPD on home O2, breathing now at baseline H/O SVT with mild troponin elevation, low risk nuclear stress test 07/2012   Plan GA with nasal ETT  Kipp Brood, MD)       Anesthesia Quick Evaluation

## 2013-03-21 ENCOUNTER — Encounter (HOSPITAL_COMMUNITY): Payer: Self-pay | Admitting: Oral Surgery

## 2013-03-21 NOTE — Op Note (Signed)
NAME:  Daniel Hooper, AIGNER NO.:  [id number]  MEDICAL RECORD NO.:  [id number]  LOCATION:  [facility]                         FACILITY:  [facility]  PHYSICIAN:  Georgia Lopes, M.D.  DATE OF BIRTH:  1945-08-05  DATE OF PROCEDURE:  03/20/2013 DATE OF DISCHARGE:  03/20/2013                              OPERATIVE REPORT   PREOPERATIVE DIAGNOSIS:  Nonrestorable teeth #8, 9, 11, 13, 14, 15, 16, 18, 19, 20, 21, 23, 24, 25, 26, 27, 28, 29, 30 bilateral mandibular lingual tori.  POSTOPERATIVE DIAGNOSIS:  Nonrestorable teeth #8, 9, 11, 13, 14, 15, 16, 18, 19, 20, 21, 23, 24, 25, 26, 27, 28, 29, 30 bilateral mandibular lingual tori.  PROCEDURE:  Extraction of teeth #8, 9, 11, 13, 14, 15, 16, 18, 19, 20, 21, 23, 24, 25, 26, 27, 28, 29, 30, alveoplasty of the right and left maxilla and mandible, removal of bilateral mandibular lingual tori.  SURGEON:  Georgia Lopes, MD  ANESTHESIA:  General.  INDICATIONS FOR PROCEDURE:  Mr. Delage is a 67 year old male who was referred to my office by his general dentist for removal of all nonrestorable teeth secondary to dental caries.  He has a past medical history complicated by COPD, hypertension, sinus tachycardia, palpitations, history of MI because of the patient's medical condition and the need for adequate anesthesia.  It was recommended that the case be performed under general anesthesia with intubation for airway protection.  PROCEDURE:  The patient was taken to the operating room, placed on the table in supine position.  General anesthesia was administered intravenously and a nasal endotracheal tube was placed and secured.  The eyes were protected, and the patient was draped for surgery.  The time- out was performed and the posterior pharynx was suctioned and the throat pack was placed.  A 2% lidocaine with 1:100,000 epinephrine was infiltrated in the right and left inferior alveolar block and in a mandibular buccal infiltration in the  anterior region and then in the maxilla, buccal, and palatal infiltration was administered.  Total of 14 mL of anesthetic solution was utilized.  A bite block was placed in the right side of the mouth and a sweetheart retractor was used to retract the tongue and the left side was operated first.  A #15 blade was used to make an incision around teeth #18, 19, 20, 21, 23, 24, 25, 26, and 27 on the buccal and lingual aspects.  The periosteum was reflected from around these teeth and then a 301 elevator was used to elevate the teeth.  The posterior teeth were removed from the mouth with the #151 universal forceps and the anterior teeth were removed with the Asch forceps.  Then, the sockets were curetted, and the periosteum was reflected to expose the alveolar crest and the mandibular lingual torus on the left side.  Then, an egg-shaped bur was used to perform the alveoplasty and the removal of the left lingual torus.  A bone file was then used to further smooth the bone, then the area was irrigated and closed with 3-0 chromic.  Then, a 15 blade was used to make an incision beginning at tooth #16 on the  buccal aspects and going anteriorly around teeth #15, 14, 13, 11, 9 and 8.  The periosteum was reflected, buckling and palatally from around these teeth and then the teeth were elevated with a 301 elevator and removed from the mouth with the upper universal forceps.  The sockets were then curetted and then the periosteum was further reflected to expose the alveolar crest on the buccal aspect, and then alveoplasty was performed using the egg-shaped bur and bone file. Then, this area was irrigated and closed with 3-0 chromic.  The bite block and sweetheart retractor were repositioned to the other side of the mouth and the 15 blade was used to make an incision around teeth #27, 28, 29, 30 on the buccal and lingual aspects.  The periosteum was reflected and then a 301 elevator was used to elevate  the teeth and the teeth were removed using the #151 forceps.  The sockets were then curetted and then the periosteum was reflected to expose the alveolar crest as well as the right lingual torus.  The alveoplasty was performed with the egg-shaped bur and bone file and then the torus was removed using the egg-shaped bur and bone file.  Then, this area was irrigated and closed with 3-0 chromic.  The oral cavity was inspected and found to have good contour hemostasis and closure.  The oral cavity was irrigated and suctioned.  The throat pack was removed.  The patient was awakened and taken to the recovery room, breathing spontaneously in good condition.  ESTIMATED BLOOD LOSS:  Minimum.  COMPLICATIONS:  None.  SPECIMENS:  None.     Georgia Lopes, M.D.     SMJ/MEDQ  D:  03/20/2013  T:  03/21/2013  Job:  [id number]

## 2013-05-24 DIAGNOSIS — C3490 Malignant neoplasm of unspecified part of unspecified bronchus or lung: Secondary | ICD-10-CM

## 2013-05-24 HISTORY — DX: Malignant neoplasm of unspecified part of unspecified bronchus or lung: C34.90

## 2013-07-31 ENCOUNTER — Other Ambulatory Visit: Payer: Self-pay | Admitting: Cardiology

## 2013-07-31 MED ORDER — METOPROLOL TARTRATE 25 MG PO TABS: 25.0000 mg | ORAL_TABLET | Freq: Two times a day (BID) | ORAL | Status: DC

## 2013-08-20 ENCOUNTER — Ambulatory Visit (INDEPENDENT_AMBULATORY_CARE_PROVIDER_SITE_OTHER): Payer: Medicare PPO | Admitting: Cardiovascular Disease

## 2013-08-20 ENCOUNTER — Encounter: Payer: Self-pay | Admitting: Cardiovascular Disease

## 2013-08-20 VITALS — BP 151/82 | HR 97 | Ht 68.0 in | Wt 140.0 lb

## 2013-08-20 DIAGNOSIS — R000 Tachycardia, unspecified: Secondary | ICD-10-CM

## 2013-08-20 DIAGNOSIS — R918 Other nonspecific abnormal finding of lung field: Secondary | ICD-10-CM

## 2013-08-20 DIAGNOSIS — I10 Essential (primary) hypertension: Secondary | ICD-10-CM

## 2013-08-20 DIAGNOSIS — I214 Non-ST elevation (NSTEMI) myocardial infarction: Secondary | ICD-10-CM

## 2013-08-20 DIAGNOSIS — R222 Localized swelling, mass and lump, trunk: Secondary | ICD-10-CM

## 2013-08-20 DIAGNOSIS — I498 Other specified cardiac arrhythmias: Secondary | ICD-10-CM

## 2013-08-20 DIAGNOSIS — I471 Supraventricular tachycardia: Secondary | ICD-10-CM

## 2013-08-20 MED ORDER — CARVEDILOL 3.125 MG PO TABS: 3.1250 mg | ORAL_TABLET | Freq: Two times a day (BID) | ORAL | Status: DC

## 2013-08-20 NOTE — Patient Instructions (Signed)
   Stop Metoprolol  Change to Coreg 3.125mg  twice a day - new sent to pharm Continue all other medications.   Nurse visit in 2 weeks for blood pressure & heart rate check  Your physician wants you to follow up in: 6 months.  You will receive a reminder letter in the mail one-two months in advance.  If you don't receive a letter, please call our office to schedule the follow up appointment

## 2013-08-20 NOTE — Progress Notes (Signed)
Patient ID: Daniel Hooper, male   DOB: 06-16-1945, 68 y.o.   MRN: [id number]      SUBJECTIVE: The patient is a 68 year old male who I am evaluating for the first time. He has a history of hypertension, SVT, sinus tachycardia, and a non-STEMI, with a very mild troponin elevation 0.08 in the setting of sinus tachycardia in the past. In March 2014, his echocardiogram reportedly revealed normal left ventricular systolic function, diastolic dysfunction, and mild inferobasal hypokinesis. A stress test on 07/31/2012 showed a small area of inferoseptal scar with no evidence of ischemia, and was deemed a low risk study. He also has a history of COPD was recent hospitalized at [facility] for a COPD exacerbation. A chest x-ray was performed which reportedly showed a right upper lobe mass suspicious for carcinoma. This was reportedly seen also on a CTA of the chest in March 2014. He is scheduled to undergo CT-guided biopsy and will followup with oncology. An MRI of the brain reportedly did not show any evidence of metastasis. He had been experiencing chest pain prior to his hospitalization approximately 2 weeks ago, but this all resolved with treatment of his COPD. He denies exertional chest pain. He denies orthopnea and leg swelling   No Known Allergies  Current Outpatient Prescriptions  Medication Sig Dispense Refill  . amLODipine (NORVASC) 5 MG tablet Take 5 mg by mouth daily before breakfast.       . aspirin 81 MG chewable tablet Chew 81 mg by mouth daily.      . budesonide-formoterol (SYMBICORT) 160-4.5 MCG/ACT inhaler Inhale 2 puffs into the lungs 2 (two) times daily.      Marland Kitchen ipratropium-albuterol (DUONEB) 0.5-2.5 (3) MG/3ML SOLN Take 3 mLs by nebulization every 6 (six) hours.       . metoprolol tartrate (LOPRESSOR) 25 MG tablet Take 1 tablet (25 mg total) by mouth 2 (two) times daily.  60 tablet  6  . montelukast (SINGULAIR) 10 MG tablet Take 10 mg by mouth at bedtime.      . nitroGLYCERIN  (NITROSTAT) 0.4 MG SL tablet Place 1 tablet (0.4 mg total) under the tongue every 5 (five) minutes as needed for chest pain. (Not more than 3 in 15 minute time frame, if no relief - proceed to ED)  25 tablet  3  . oxyCODONE-acetaminophen (PERCOCET) 5-325 MG per tablet Take 1-2 tablets by mouth every 4 (four) hours as needed for pain.  40 tablet  0  . OXYGEN-HELIUM IN Inhale into the lungs at bedtime as needed.      . predniSONE (STERAPRED UNI-PAK) 10 MG tablet Take 1 tablet by mouth as directed.      . simvastatin (ZOCOR) 20 MG tablet Take 20 mg by mouth daily at 6 PM.       . theophylline (THEODUR) 200 MG 12 hr tablet Take 200 mg by mouth 2 (two) times daily.      . VENTOLIN HFA 108 (90 BASE) MCG/ACT inhaler Inhale 2 puffs into the lungs every 6 (six) hours as needed for shortness of breath.        No current facility-administered medications for this visit.    Past Medical History  Diagnosis Date  . Hypoxic     History of hypoxic respiratory failure  . Sinus tachycardia   . Palpitations   . Non-ST elevated myocardial infarction (non-STEMI)   . Hypertension   . COPD (chronic obstructive pulmonary disease)     History of tobacco use, O2 - 2  liters per min., uses on & off   . Shortness of breath   . H/O echocardiogram 2013    Past Surgical History  Procedure Laterality Date  . None    . Multiple extractions with alveoloplasty N/A 03/20/2013    Procedure: MULTIPLE EXTRACION 8, 9, 11, 13, 14, 15, 16, 18, 19, 20, 21, 23, 24, 25, 26, 27, 28, 29, 30 WITH ALVEOLOPLASTY X 4, BILATERAL TORI;  Surgeon: Gae Bon, DDS;  Location: [place];  Service: Oral Surgery;  Laterality: N/A;    History   Social History  . Marital Status: Single    Spouse Name: N/A    Number of Children: N/A  . Years of Education: N/A   Occupational History  . Not on file.   Social History Main Topics  . Smoking status: Former Smoker -- 1.00 packs/day for 30 years    Types: Cigarettes    Quit date:  05/24/2006  . Smokeless tobacco: Not on file  . Alcohol Use: No     Comment: quit- 2012- since need O2 for breathing   . Drug Use: No  . Sexual Activity: Not on file   Other Topics Concern  . Not on file   Social History Narrative  . No narrative on file     Filed Vitals:   08/20/13 1431  BP: 151/82  Pulse: 97  Height: 5\' 8"  (1.727 m)  Weight: 140 lb (63.504 kg)  SpO2: 100%    PHYSICAL EXAM General: NAD, using oxygen by nasal cannula Neck: No JVD, no thyromegaly. Lungs: Clear but diminished to auscultation bilaterally with reduced respiratory effort. CV: Nondisplaced PMI.  Regular rate and rhythm, normal S1/S2, no S3/S4, no murmur. No pretibial or periankle edema.  No carotid bruit.  Normal pedal pulses.  Abdomen: Soft, nontender, no hepatosplenomegaly, no distention.  Neurologic: Alert and oriented x 3.  Psych: Normal affect. Extremities: No clubbing or cyanosis.   ECG: reviewed and available in electronic records.      ASSESSMENT AND PLAN: 1. SVT: no further recurrences. 2. Sinus tachycardia: resting HR 97 bpm today. However, he is on metoprolol which has the definite propensity for exacerbating bronchospasms given his significant COPD. I will discontinue metoprolol and switch this to carvedilol 3.125 mg twice daily. I will have him return for a nurse's visit in 2 weeks to both check his heart rate and blood pressure, and to determine if an increase of carvedilol is warranted. 3. NSTEMI: He has normal left ventricular systolic function as per an echocardiogram in March 2014, and also had a low risk nuclear stress test at that time. His troponin was only mildly elevated at .08 in the setting of sinus tachycardia at that time approximately one year ago. He has had no further hospitalizations related to cardiac issues since then. No noninvasive testing is warranted at this time. He denies exertional chest pain. 4. Hypertension: Mildly elevated today. He takes amlodipine 5  mg daily. I'm discontinuing metoprolol due to his significant COPD, and starting carvedilol. This will be reevaluated at a nurse's visit in 2 weeks. 5. Pulmonary mass: He is scheduled to undergo CT-guided biopsy this Wednesday.  Dispo: f/u for nurse visit in 2 weeks. F/u with me in 6 months.  Kate Sable, M.D., F.A.C.C.

## 2013-08-27 ENCOUNTER — Other Ambulatory Visit (HOSPITAL_COMMUNITY): Payer: Self-pay | Admitting: Oncology

## 2013-08-27 DIAGNOSIS — C3490 Malignant neoplasm of unspecified part of unspecified bronchus or lung: Secondary | ICD-10-CM

## 2013-08-31 ENCOUNTER — Ambulatory Visit (HOSPITAL_COMMUNITY): Payer: Medicare PPO

## 2013-09-05 ENCOUNTER — Encounter (HOSPITAL_COMMUNITY)
Admission: RE | Admit: 2013-09-05 | Discharge: 2013-09-05 | Disposition: A | Discharge status: Home / Self Care | Payer: Medicare PPO | Source: Ambulatory Visit | Attending: Oncology | Admitting: Oncology

## 2013-09-05 DIAGNOSIS — C3490 Malignant neoplasm of unspecified part of unspecified bronchus or lung: Secondary | ICD-10-CM | POA: Diagnosis present

## 2013-09-05 LAB — GLUCOSE, CAPILLARY: GLUCOSE-CAPILLARY: 73 mg/dL (ref 70–99)

## 2013-09-05 MED ORDER — FLUDEOXYGLUCOSE F - 18 (FDG) INJECTION
8.4000 | Freq: Once | INTRAVENOUS | Status: AC | PRN
  Administered 2013-09-05: 8.4 via INTRAVENOUS

## 2013-09-06 ENCOUNTER — Ambulatory Visit (INDEPENDENT_AMBULATORY_CARE_PROVIDER_SITE_OTHER): Payer: Medicare PPO | Admitting: *Deleted

## 2013-09-06 ENCOUNTER — Telehealth: Payer: Self-pay | Admitting: *Deleted

## 2013-09-06 VITALS — BP 146/79 | HR 72 | Ht 68.0 in | Wt 139.0 lb

## 2013-09-06 DIAGNOSIS — I498 Other specified cardiac arrhythmias: Secondary | ICD-10-CM

## 2013-09-06 DIAGNOSIS — R000 Tachycardia, unspecified: Secondary | ICD-10-CM

## 2013-09-06 MED ORDER — CARVEDILOL 6.25 MG PO TABS: 6.2500 mg | ORAL_TABLET | Freq: Two times a day (BID) | ORAL | Status: DC

## 2013-09-06 NOTE — Telephone Encounter (Signed)
Message copied by Laurine Blazer on Thu Sep 06, 2013  1:12 PM ------      Message from: Merlene Laughter      Created: Thu Sep 06, 2013 10:37 AM                   ----- Message -----         From: Herminio Commons, MD         Sent: 09/06/2013  10:11 AM           To: Merlene Laughter, LPN            Please increase Coreg to 6.25 mg bid.      Thanks.            ----- Message -----         From: Merlene Laughter, LPN         Sent: 6/80/8811   9:17 AM           To: Herminio Commons, MD                   ------

## 2013-09-06 NOTE — Progress Notes (Signed)
Patient presents to office today for nurse visit to check blood pressure and heart rate due to recent change in beta blockers. Patient was started on carvedilol 3.125 mg twice daily. Patient has taken all doses of medications and reports no side effects to new medication. No c/o chest pain, sob or dizziness.

## 2013-09-06 NOTE — Telephone Encounter (Signed)
Patient notified and verbalized understanding.  Will send new rx to [facility].

## 2013-09-19 ENCOUNTER — Encounter: Payer: Self-pay | Admitting: Radiation Oncology

## 2013-09-19 ENCOUNTER — Ambulatory Visit
Admission: RE | Admit: 2013-09-19 | Discharge: 2013-09-19 | Disposition: A | Discharge status: Home / Self Care | Payer: Medicare PPO | Source: Ambulatory Visit | Attending: Radiation Oncology | Admitting: Radiation Oncology

## 2013-09-19 DIAGNOSIS — R0602 Shortness of breath: Secondary | ICD-10-CM | POA: Diagnosis not present

## 2013-09-19 DIAGNOSIS — Z7982 Long term (current) use of aspirin: Secondary | ICD-10-CM | POA: Diagnosis not present

## 2013-09-19 DIAGNOSIS — Z9981 Dependence on supplemental oxygen: Secondary | ICD-10-CM | POA: Diagnosis not present

## 2013-09-19 DIAGNOSIS — C3410 Malignant neoplasm of upper lobe, unspecified bronchus or lung: Secondary | ICD-10-CM | POA: Insufficient documentation

## 2013-09-19 DIAGNOSIS — Z79899 Other long term (current) drug therapy: Secondary | ICD-10-CM | POA: Insufficient documentation

## 2013-09-19 DIAGNOSIS — Z510 Encounter for antineoplastic radiation therapy: Secondary | ICD-10-CM | POA: Diagnosis present

## 2013-09-19 DIAGNOSIS — C3411 Malignant neoplasm of upper lobe, right bronchus or lung: Secondary | ICD-10-CM | POA: Insufficient documentation

## 2013-09-19 HISTORY — DX: Malignant neoplasm of unspecified part of unspecified bronchus or lung: C34.90

## 2013-09-19 NOTE — Progress Notes (Signed)
COMPLEX CT SIMULATION / TREATMENT PLANNING NOTE Right upper lung cancer Outpatient  The patient was positioned on the CT simulator in a complex treatment device custom fitted to their body: A body fix blue bag. The patient's head was in an Accuform.  The patient's arms were over their head. An abdominal compression device was snugly fitted to decrease the patient's intrathoracic movements. High-resolution CT axial imaging with 4D technology and with thin slices was obtained of the chest. I verified that the images were good for treatment planning. Physics was on hand for the simulation due to the highly technical nature of it.   RESPIRATORY MOTION MANAGEMENT SIMULATION  NARRATIVE:  In order to account for effect of respiratory motion on target structures and other organs in the planning and delivery of radiotherapy, this patient underwent respiratory motion management simulation.  To accomplish this, when the patient was brought to the CT simulation planning suite, 4D respiratoy motion management CT images were obtained.  The CT images were loaded into the planning software.  Then, using a variety of tools including Cine, MIP, and standard views, the target volume and planning target volumes (PTV) were delineated.  Avoidance structures were contoured.  Treatment planning then occurred.     I contoured the patient's ITV and increased ITV with tight margins for the PTV. I will prescribe 50 Gy in 5 fractions of 10 Gy per fraction every other day to the right upper lung tumor. I requested a DVH of the patient's lungs, target volumes, esophagus, heart, spinal cord, chest wall,  and airways for 3D conformal planning.  Cone beam CT scans will be performed prior to each fraction to allow close PTV margins and sparing of normal tissues from high doses.   -----------------------------------  Eppie Gibson, MD

## 2013-09-19 NOTE — Progress Notes (Signed)
Pt denies pain, cough, loss of appetite. He does occasionally have slight right sided chest pain but states it goes away quickly.  He has SOB w/minimal activity and uses O2 @ 2L/min 24 hours a day. Pt lives with his sister, has 1 son and 1 grand daughter in [place]. 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 Pt for ct sim today, will see Dr Isidore Moos for consent first.

## 2013-09-19 NOTE — Progress Notes (Signed)
Radiation Oncology         [phone] ________________________________  Name: TERREON EKHOLM MRN: [id number]  Date: 09/19/2013  DOB: [birth year]  Follow-Up Visit Note  outpatient  CC: Deloria Lair, MD  Deloria Lair., MD  Diagnosis and Prior Radiotherapy:   T2aN0M0 Adenocarcinoma, Right Upper Lung  Narrative:  The patient returns today for routine follow-up.  He was able to get to our clinic through public transportation services without any major difficulty. No new complaints today.    Pt denies pain, cough, loss of appetite. He does occasionally have slight right sided chest pain but states it goes away quickly. He has SOB w/minimal activity and uses O2 @ 2L/min 24 hours a day.                        ALLERGIES:  has No Known Allergies.  Meds: Current Outpatient Prescriptions  Medication Sig Dispense Refill  . amLODipine (NORVASC) 5 MG tablet Take 5 mg by mouth daily before breakfast.       . aspirin 81 MG chewable tablet Chew 81 mg by mouth daily.      . budesonide-formoterol (SYMBICORT) 160-4.5 MCG/ACT inhaler Inhale 2 puffs into the lungs 2 (two) times daily.      . carvedilol (COREG) 6.25 MG tablet Take 1 tablet (6.25 mg total) by mouth 2 (two) times daily.  60 tablet  6  . ipratropium-albuterol (DUONEB) 0.5-2.5 (3) MG/3ML SOLN Take 3 mLs by nebulization every 6 (six) hours.       . montelukast (SINGULAIR) 10 MG tablet Take 10 mg by mouth at bedtime.      . nitroGLYCERIN (NITROSTAT) 0.4 MG SL tablet Place 1 tablet (0.4 mg total) under the tongue every 5 (five) minutes as needed for chest pain. (Not more than 3 in 15 minute time frame, if no relief - proceed to ED)  25 tablet  3  . oxyCODONE-acetaminophen (PERCOCET) 5-325 MG per tablet Take 1-2 tablets by mouth every 4 (four) hours as needed for pain.  40 tablet  0  . OXYGEN-HELIUM IN Inhale into the lungs at bedtime as needed.      . simvastatin (ZOCOR) 20 MG tablet Take 20 mg by mouth daily at 6 PM.       . theophylline  (THEODUR) 200 MG 12 hr tablet Take 200 mg by mouth 2 (two) times daily.      . VENTOLIN HFA 108 (90 BASE) MCG/ACT inhaler Inhale 2 puffs into the lungs every 6 (six) hours as needed for shortness of breath.        No current facility-administered medications for this encounter.    Physical Findings: The patient is in no acute distress. Patient is alert and oriented.    Breathing oxygen per nasal cannula. Vitals with Age-Percentiles 10/19/4130  Length   Systolic 440  Diastolic 73  Pulse 75  Respiration 20  Weight 65.273 kg  BMI   VISIT REPORT     Lab Findings: Lab Results  Component Value Date   WBC 7.8 03/15/2013   HGB 15.5 03/15/2013   HCT 43.7 03/15/2013   MCV 93.0 03/15/2013   PLT 278 03/15/2013    Radiographic Findings: Nm Pet Image Initial (pi) Skull Base To Thigh  09/05/2013   CLINICAL DATA:  Initial treatment strategy for lung carcinoma.  EXAM: NUCLEAR MEDICINE PET SKULL BASE TO THIGH  TECHNIQUE: 8.4 mCi F-18 FDG was injected intravenously. Full-ring PET imaging was performed from the  skull base to thigh after the radiotracer. CT data was obtained and used for attenuation correction and anatomic localization.  FASTING BLOOD GLUCOSE:  Value: 73 mg/dl  COMPARISON:  CT BIOPSY dated 08/22/2013  FINDINGS: NECK  No hypermetabolic lymph nodes in the neck.  CHEST  There is irregular spiculated mass in the peripheral aspect of the right upper lobe measuring 3.5 x 1.9 cm with relatively low metabolic activity for size with SUV max of 2.4. No additional hypermetabolic pulmonary nodules are present. There is a mildly hypermetabolic right lower paratracheal lymph node which measures 9 mm short axis with SUV max equal 1. 9.  ABDOMEN/PELVIS  No abnormal hypermetabolic activity within the liver, pancreas, adrenal glands, or spleen. No hypermetabolic lymph nodes in the abdomen or pelvis.  SKELETON  No focal hypermetabolic activity to suggest skeletal metastasis.  IMPRESSION: 1. Mildly  hypermetabolic right upper lobe mass is concerning for bronchogenic adenocarcinoma. 2. Minimally hypermetabolic right lower paratracheal lymph node consistent with reactive lymph node versus a metastatic node. 3. No additional evidence of metastasis.   Electronically Signed   By: Suzy Bouchard M.D.   On: 09/05/2013 16:20    Impression/Plan:  Consent form was reviewed and signed today for SBRT to the patient's right upper lung. As discussed at [facility], I recommend that he receive 5 fractions of SBRT to his right upper lung tumor. This is with curative intent. The most common side effect would be some fatigue. He could have a cough during treatment and possibly some skin irritation over the right side of his torso. There is a chance of some chest wall discomfort or pain in long-term due to the location of the tumor and the fact that part of the chest wall will be exposed to radiation. He will likely develop some limited scarring of the lung. He is enthusiastic to proceed.   _____________________________________   Eppie Gibson, MD

## 2013-09-21 DIAGNOSIS — Z510 Encounter for antineoplastic radiation therapy: Secondary | ICD-10-CM | POA: Diagnosis not present

## 2013-09-24 DIAGNOSIS — Z510 Encounter for antineoplastic radiation therapy: Secondary | ICD-10-CM | POA: Diagnosis not present

## 2013-10-01 ENCOUNTER — Ambulatory Visit
Admission: RE | Admit: 2013-10-01 | Discharge: 2013-10-01 | Disposition: A | Discharge status: Home / Self Care | Payer: Medicare PPO | Source: Ambulatory Visit | Attending: Radiation Oncology | Admitting: Radiation Oncology

## 2013-10-01 ENCOUNTER — Encounter: Payer: Self-pay | Admitting: Radiation Oncology

## 2013-10-01 VITALS — BP 123/75 | HR 74 | Temp 97.7°F | Ht 68.0 in | Wt 140.1 lb

## 2013-10-01 DIAGNOSIS — Z510 Encounter for antineoplastic radiation therapy: Secondary | ICD-10-CM | POA: Diagnosis not present

## 2013-10-01 DIAGNOSIS — C3410 Malignant neoplasm of upper lobe, unspecified bronchus or lung: Secondary | ICD-10-CM

## 2013-10-01 NOTE — Progress Notes (Signed)
Daniel Hooper has received his first SBRT treatment.  Educated to inform nursing and Dr.Squire if he has increasing SOB, or pain during his treatments.

## 2013-10-01 NOTE — Progress Notes (Signed)
   Weekly Management Note:  outpatient Current Dose:  10 Gy  Projected Dose: 50 Gy   Narrative:  The patient presents for routine under treatment assessment.  CBCT/MVCT images/Port film x-rays were reviewed.  The chart was checked. Doing well, no new complaints.  Physical Findings: Vitals - 1 value per visit 08/14/5571  SYSTOLIC 220  DIASTOLIC 75  Pulse 74  Temperature 97.7  Respirations   Weight (lb) 140.1  Height 5\' 8"   BMI 21.31  VISIT REPORT    .breathing oxygen by [place]  Impression:  The patient is tolerating radiotherapy.  Plan:  Continue radiotherapy as planned. I let the patient know that my partner, Dr. Valere Dross, will be covering for me next week during my vacation.  I will arrange 3 mo f/u with BUN, Cr, Chest CT with contrast, and visit with me thereafter at [facility].  ________________________________   Eppie Gibson, M.D.

## 2013-10-01 NOTE — Progress Notes (Signed)
  Radiation Oncology         [phone] ________________________________  Name: Daniel Hooper MRN: [id number]  Date: 10/01/2013  DOB: Jun 22, 1945  Stereotactic Body Radiotherapy Treatment Procedure Note Outpatient Fraction 1 of 5 Right upper lung  NARRATIVE:  Daniel Hooper was brought to the stereotactic radiation treatment machine and placed supine on the CT couch. The patient was set up for stereotactic body radiotherapy on the body fix pillow.  3D TREATMENT PLANNING AND DOSIMETRY:  The patient's radiation plan was reviewed and approved prior to starting treatment.  It showed 3-dimensional radiation distributions overlaid onto the planning CT.  The [facility] for the target structures as well as the organs at risk were reviewed. The documentation of this is filed in the radiation oncology EMR.  SIMULATION VERIFICATION:  The patient underwent CT imaging on the treatment unit.  These were carefully aligned to document that the ablative radiation dose would cover the target volume and maximally spare the nearby organs at risk according to the planned distribution.  SPECIAL TREATMENT PROCEDURE: Daniel Hooper received high dose ablative stereotactic body radiotherapy to the planned target volume without unforeseen complications. Treatment was delivered uneventfully. The high doses associated with stereotactic body radiotherapy and the significant potential risks require careful treatment set up and patient monitoring constituting a special treatment procedure   STEREOTACTIC TREATMENT MANAGEMENT:  Following delivery, the patient was evaluated clinically. The patient tolerated treatment without significant acute effects, and was discharged to home in stable condition.    PLAN: Continue treatment as planned.  ________________________________  Eppie Gibson, MD

## 2013-10-02 ENCOUNTER — Telehealth: Payer: Self-pay | Admitting: *Deleted

## 2013-10-02 NOTE — Telephone Encounter (Signed)
Called patient to inform of labs, test and fu visit on 01-18-14, spoke with patient and he is aware of these appts.

## 2013-10-03 ENCOUNTER — Encounter: Payer: Self-pay | Admitting: Radiation Oncology

## 2013-10-03 ENCOUNTER — Ambulatory Visit
Admission: RE | Admit: 2013-10-03 | Discharge: 2013-10-03 | Disposition: A | Discharge status: Home / Self Care | Payer: Medicare PPO | Source: Ambulatory Visit | Attending: Radiation Oncology | Admitting: Radiation Oncology

## 2013-10-03 DIAGNOSIS — Z510 Encounter for antineoplastic radiation therapy: Secondary | ICD-10-CM | POA: Diagnosis not present

## 2013-10-03 DIAGNOSIS — C3410 Malignant neoplasm of upper lobe, unspecified bronchus or lung: Secondary | ICD-10-CM

## 2013-10-03 NOTE — Progress Notes (Signed)
  Radiation Oncology         [phone] ________________________________  Name: Daniel Hooper MRN: [id number]  Date: 10/03/2013  DOB: Feb 05, 1946  Stereotactic Body Radiotherapy Treatment Procedure Note  NARRATIVE:  Daniel Hooper was brought to the stereotactic radiation treatment machine and placed supine on the CT couch. The patient was set up for stereotactic body radiotherapy on the body fix pillow.  3D TREATMENT PLANNING AND DOSIMETRY:  The patient's radiation plan was reviewed and approved prior to starting treatment.  It showed 3-dimensional radiation distributions overlaid onto the planning CT.  The [facility] for the target structures as well as the organs at risk were reviewed. The documentation of this is filed in the radiation oncology EMR.  SIMULATION VERIFICATION:  The patient underwent CT imaging on the treatment unit.  These were carefully aligned to document that the ablative radiation dose would cover the right upper lobe target volume and maximally spare the nearby organs at risk according to the planned distribution.  SPECIAL TREATMENT PROCEDURE: Daniel Hooper received high dose ablative stereotactic body radiotherapy to the planned target volume without unforeseen complications. Treatment was delivered uneventfully. He received a further 1000 cGy of a prescribed 5000 cGy in 5 sessions. The high doses associated with stereotactic body radiotherapy and the significant potential risks require careful treatment set up and patient monitoring constituting a special treatment procedure   STEREOTACTIC TREATMENT MANAGEMENT:  Following delivery, the patient was evaluated clinically. The patient tolerated treatment without significant acute effects, and was discharged to home in stable condition.    PLAN: Continue treatment as planned.  ------------------------------------------------        Rexene Edison, MD

## 2013-10-05 ENCOUNTER — Ambulatory Visit
Admission: RE | Admit: 2013-10-05 | Discharge: 2013-10-05 | Disposition: A | Discharge status: Home / Self Care | Payer: Medicare PPO | Source: Ambulatory Visit | Attending: Radiation Oncology | Admitting: Radiation Oncology

## 2013-10-05 ENCOUNTER — Encounter: Payer: Self-pay | Admitting: Radiation Oncology

## 2013-10-05 DIAGNOSIS — Z510 Encounter for antineoplastic radiation therapy: Secondary | ICD-10-CM | POA: Diagnosis not present

## 2013-10-08 ENCOUNTER — Ambulatory Visit
Admission: RE | Admit: 2013-10-08 | Discharge: 2013-10-08 | Disposition: A | Discharge status: Home / Self Care | Payer: Medicare PPO | Source: Ambulatory Visit | Attending: Radiation Oncology | Admitting: Radiation Oncology

## 2013-10-08 ENCOUNTER — Encounter: Payer: Self-pay | Admitting: Radiation Oncology

## 2013-10-08 DIAGNOSIS — Z510 Encounter for antineoplastic radiation therapy: Secondary | ICD-10-CM | POA: Diagnosis not present

## 2013-10-08 NOTE — Progress Notes (Signed)
  Radiation Oncology         [phone] ________________________________  Name: HANSEL DEVAN MRN: [id number]  Date: 10/08/2013  DOB: 04-15-1946  Stereotactic Body Radiotherapy Treatment Procedure Note  NARRATIVE:  [name] was brought to the stereotactic radiation treatment machine and placed supine on the CT couch. The patient was set up for stereotactic body radiotherapy on the body fix pillow.  3D TREATMENT PLANNING AND DOSIMETRY:  The patient's radiation plan was reviewed and approved prior to starting treatment.  It showed 3-dimensional radiation distributions overlaid onto the planning CT.  The [facility] for the target structures as well as the organs at risk were reviewed. The documentation of this is filed in the radiation oncology EMR.  SIMULATION VERIFICATION:  The patient underwent CT imaging on the treatment unit.  These were carefully aligned to document that the ablative radiation dose would cover the right upper lobe target volume and maximally spare the nearby organs at risk according to the planned distribution.  SPECIAL TREATMENT PROCEDURE: [name] received high dose ablative stereotactic body radiotherapy to the planned target volume without unforeseen complications. Treatment was delivered uneventfully. He received an additional 1000 cGy for a cumulative dose of 4000 cGy in 4 sessions of a prescribed 5000 cGy in 5 sessions The high doses associated with stereotactic body radiotherapy and the significant potential risks require careful treatment set up and patient monitoring constituting a special treatment procedure   STEREOTACTIC TREATMENT MANAGEMENT:  Following delivery, the patient was evaluated clinically. The patient tolerated treatment without significant acute effects, and was discharged to home in stable condition.    PLAN: Continue treatment as planned.  ------------------------------------------------        Rexene Edison, MD

## 2013-10-10 ENCOUNTER — Encounter: Payer: Self-pay | Admitting: Radiation Oncology

## 2013-10-10 ENCOUNTER — Ambulatory Visit
Admission: RE | Admit: 2013-10-10 | Discharge: 2013-10-10 | Disposition: A | Discharge status: Home / Self Care | Payer: Medicare PPO | Source: Ambulatory Visit | Attending: Radiation Oncology | Admitting: Radiation Oncology

## 2013-10-10 DIAGNOSIS — Z510 Encounter for antineoplastic radiation therapy: Secondary | ICD-10-CM | POA: Diagnosis not present

## 2013-10-13 NOTE — Progress Notes (Signed)
  Radiation Oncology         [phone] ________________________________  Name: Daniel Hooper MRN: [id number]  Date: 10/10/2013  DOB: 1946/05/18  End of Treatment Note  Diagnosis:  T2aN0M0 Adenocarcinoma, Right Upper Lung  Indication for treatment:  curative       Radiation treatment dates:    10/01/2013, 10/03/2013, 10/05/2013, 10/08/2013, 10/10/2013  Site/dose:   Right upper lung / 50 Gy in 5 fractions  Beams/energy:   SBRT 3D conformal with 2 VMAT arcs / 6 MV FFF photons  Narrative: The patient tolerated radiation treatment relatively well.     Plan: The patient has completed radiation treatment. The patient will return to radiation oncology in 3 months for a physical exam following a CT of his chest on 01-18-14 at [place].  I advised them to call or return sooner if they have any questions or concerns related to their recovery or treatment.  -----------------------------------  Eppie Gibson, MD

## 2013-10-24 ENCOUNTER — Telehealth: Payer: Self-pay | Admitting: Cardiology

## 2013-10-24 NOTE — Telephone Encounter (Signed)
Will forward to [place] and Dr Percival Spanish for review

## 2013-10-24 NOTE — Telephone Encounter (Signed)
New message    FYI Pt was admitted to [facility] on 10-21-13 for respiratory failure.  Pt has been discharged.  Do not know if he was discharged to his home or to another facitlity.  Will fax this info to you.

## 2013-10-25 NOTE — Telephone Encounter (Signed)
Noted  

## 2013-11-06 ENCOUNTER — Ambulatory Visit: Payer: Medicare PPO | Admitting: Radiation Oncology

## 2014-01-16 NOTE — Progress Notes (Addendum)
  Radiation Oncology         [phone] ________________________________  Name: Daniel Hooper MRN: [id number]  Date: 10/10/2013  DOB: [birth year]  Stereotactic Body Radiotherapy Treatment Procedure Note  NARRATIVE:  Daniel Hooper was brought to the stereotactic radiation treatment machine and placed supine on the CT couch. The patient was set up for stereotactic body radiotherapy on the body fix pillow.  3D TREATMENT PLANNING AND DOSIMETRY:  The patient's radiation plan was reviewed and approved prior to starting treatment.  It showed 3-dimensional radiation distributions overlaid onto the planning CT.  The [facility] for the target structures as well as the organs at risk were reviewed. The documentation of this is filed in the radiation oncology EMR.  SIMULATION VERIFICATION:  The patient underwent CT imaging on the treatment unit.  These were carefully aligned to document that the ablative radiation dose would cover the target volume and maximally spare the nearby organs at risk according to the planned distribution.  SPECIAL TREATMENT PROCEDURE: Daniel Hooper received high dose ablative stereotactic body radiotherapy to the planned target volume without unforeseen complications. Treatment was delivered uneventfully. The high doses associated with stereotactic body radiotherapy and the significant potential risks require careful treatment set up and patient monitoring constituting a special treatment procedure   STEREOTACTIC TREATMENT MANAGEMENT:  Following delivery, the patient was evaluated clinically. The patient tolerated treatment without significant acute effects, and was discharged to home in stable condition.    PLAN: Followup in August, following CT imaging.  ________________________________  Eppie Gibson, MD

## 2014-01-16 NOTE — Progress Notes (Signed)
  Radiation Oncology         [phone] ________________________________  Name: Daniel Hooper MRN: [id number]  Date: 10/05/2013  DOB: 01/05/1946  Stereotactic Body Radiotherapy Treatment Procedure Note  NARRATIVE:  Daniel Hooper was brought to the stereotactic radiation treatment machine and placed supine on the CT couch. The patient was set up for stereotactic body radiotherapy on the body fix pillow.  3D TREATMENT PLANNING AND DOSIMETRY:  The patient's radiation plan was reviewed and approved prior to starting treatment.  It showed 3-dimensional radiation distributions overlaid onto the planning CT.  The [facility] for the target structures as well as the organs at risk were reviewed. The documentation of this is filed in the radiation oncology EMR.  SIMULATION VERIFICATION:  The patient underwent CT imaging on the treatment unit.  These were carefully aligned to document that the ablative radiation dose would cover the target volume and maximally spare the nearby organs at risk according to the planned distribution.  SPECIAL TREATMENT PROCEDURE: Daniel Hooper received high dose ablative stereotactic body radiotherapy to the planned target volume without unforeseen complications. Treatment was delivered uneventfully. The high doses associated with stereotactic body radiotherapy and the significant potential risks require careful treatment set up and patient monitoring constituting a special treatment procedure   STEREOTACTIC TREATMENT MANAGEMENT:  Following delivery, the patient was evaluated clinically. The patient tolerated treatment without significant acute effects, and was discharged to home in stable condition.    PLAN: Continue treatment as planned.  ________________________________  Eppie Gibson, MD

## 2014-01-17 ENCOUNTER — Encounter: Payer: Self-pay | Admitting: Radiation Oncology

## 2014-01-18 ENCOUNTER — Ambulatory Visit (HOSPITAL_COMMUNITY)
Admission: RE | Admit: 2014-01-18 | Discharge: 2014-01-18 | Disposition: A | Discharge status: Home / Self Care | Payer: Medicare PPO | Source: Ambulatory Visit | Attending: Radiation Oncology | Admitting: Radiation Oncology

## 2014-01-18 ENCOUNTER — Encounter: Payer: Self-pay | Admitting: Radiation Oncology

## 2014-01-18 ENCOUNTER — Ambulatory Visit
Admission: RE | Admit: 2014-01-18 | Discharge: 2014-01-18 | Disposition: A | Discharge status: Home / Self Care | Payer: Medicare PPO | Source: Ambulatory Visit | Attending: Radiation Oncology | Admitting: Radiation Oncology

## 2014-01-18 ENCOUNTER — Encounter (HOSPITAL_COMMUNITY): Payer: Self-pay

## 2014-01-18 VITALS — BP 137/69 | HR 79 | Temp 97.7°F | Resp 20 | Ht 68.0 in | Wt 137.9 lb

## 2014-01-18 DIAGNOSIS — Z923 Personal history of irradiation: Secondary | ICD-10-CM | POA: Diagnosis not present

## 2014-01-18 DIAGNOSIS — C3410 Malignant neoplasm of upper lobe, unspecified bronchus or lung: Secondary | ICD-10-CM

## 2014-01-18 HISTORY — DX: Personal history of irradiation: Z92.3

## 2014-01-18 LAB — BUN AND CREATININE (CC13)
BUN: 7.9 mg/dL (ref 7.0–26.0)
Creatinine: 0.7 mg/dL (ref 0.7–1.3)

## 2014-01-18 MED ORDER — IOHEXOL 300 MG/ML  SOLN
80.0000 mL | Freq: Once | INTRAMUSCULAR | Status: AC | PRN
  Administered 2014-01-18: 80 mL via INTRAVENOUS

## 2014-01-18 NOTE — Progress Notes (Signed)
Radiation Oncology         [phone] ________________________________  Name: Daniel Hooper MRN: [id number]  Date: 01/18/2014  DOB: 03/05/1946  Follow-Up Visit Note  outpatient  CC: Deloria Lair, MD  Pieter Partridge, MD  Diagnosis and Prior Radiotherapy:   T2aN0M0 Adenocarcinoma, Right Upper Lung  Indication for treatment: curative  Radiation treatment dates: 10/01/2013, 10/03/2013, 10/05/2013, 10/08/2013, 10/10/2013  Site/dose: Right upper lung / 50 Gy in 5 fractions   Narrative: He feels "Great."  Breathing stable on 2L [place].  CT of chest shows Spiculated mass in the right upper lobe currently measures 2.0 x 2.2 cm  compared to 3.0 x 3.2 cm previously. No new concerning  lung lesions or nodes. Incidental Liver lesions are hemangiomas/benign vs metastatic, MRI recommended.                              ALLERGIES:  has No Known Allergies.  Meds: Current Outpatient Prescriptions  Medication Sig Dispense Refill  . amLODipine (NORVASC) 5 MG tablet Take 5 mg by mouth daily before breakfast.       . budesonide-formoterol (SYMBICORT) 160-4.5 MCG/ACT inhaler Inhale 2 puffs into the lungs 2 (two) times daily.      . carvedilol (COREG) 6.25 MG tablet Take 1 tablet (6.25 mg total) by mouth 2 (two) times daily.  60 tablet  6  . ipratropium-albuterol (DUONEB) 0.5-2.5 (3) MG/3ML SOLN Take 3 mLs by nebulization every 6 (six) hours.       . metoprolol tartrate (LOPRESSOR) 25 MG tablet       . montelukast (SINGULAIR) 10 MG tablet Take 10 mg by mouth at bedtime.      . nitroGLYCERIN (NITROSTAT) 0.4 MG SL tablet Place 1 tablet (0.4 mg total) under the tongue every 5 (five) minutes as needed for chest pain. (Not more than 3 in 15 minute time frame, if no relief - proceed to ED)  25 tablet  3  . OXYGEN-HELIUM IN Inhale into the lungs at bedtime as needed.      . simvastatin (ZOCOR) 20 MG tablet Take 20 mg by mouth daily at 6 PM.       . SPIRIVA HANDIHALER 18 MCG inhalation capsule       .  theophylline (THEODUR) 200 MG 12 hr tablet Take 200 mg by mouth 2 (two) times daily.      . VENTOLIN HFA 108 (90 BASE) MCG/ACT inhaler Inhale 2 puffs into the lungs every 6 (six) hours as needed for shortness of breath.       Marland Kitchen aspirin 81 MG chewable tablet Chew 81 mg by mouth daily.       No current facility-administered medications for this encounter.    Physical Findings: The patient is in no acute distress. Patient is alert and oriented. [place] O2.  height is 5\' 8"  (1.727 m) and weight is 137 lb 14.4 oz (62.551 kg). His temperature is 97.7 F (36.5 C). His blood pressure is 137/69 and his pulse is 79. His respiration is 20 and oxygen saturation is 100%. .    Lungs - decreased breath sounds bilaterally.  Heart RRR.  Lab Findings: Lab Results  Component Value Date   WBC 7.8 03/15/2013   HGB 15.5 03/15/2013   HCT 43.7 03/15/2013   MCV 93.0 03/15/2013   PLT 278 03/15/2013    Radiographic Findings: Ct Chest W Contrast  01/18/2014   CLINICAL DATA:  Followup lung carcinoma.  Restaging. Recently completed radiation therapy.  EXAM: CT CHEST WITH CONTRAST  TECHNIQUE: Multidetector CT imaging of the chest was performed during intravenous contrast administration.  CONTRAST:  36mL OMNIPAQUE IOHEXOL 300 MG/ML  SOLN  COMPARISON:  PET-CT on 09/05/2013  FINDINGS: Mediastinum/[place]: No masses or pathologically enlarged lymph nodes identified. 7 mm right paratracheal lymph node remains stable.  Other Thoracic Lymphadenopathy:  None.  Lungs: Spiculated mass in the right upper lobe currently measures 2.0 x 2.2 cm on image 22 compared to 3.0 x 3.2 cm previously. No new or enlarging pulmonary nodules or masses are seen. Mild emphysema again noted.  Pleura:  No evidence of effusion or mass.  Vascular/Cardiac: No thoracic aortic aneurysm or other significant abnormality identified.  Musculoskeletal:  No suspicious bone lesions identified.  Other: A hypervascular lesion measuring at least 1.3 cm is seen in the  inferior aspect of the medial segment left hepatic lobe on image 61, which is indeterminate. A second tiny sub-cm hypervascular focus is also seen in the lateral segment of the left lobe. Differential diagnosis includes benign etiology such as a flash filling hemangiomas although hypervascular metastases cannot be excluded. Both adrenal glands are normal in appearance.  IMPRESSION: Mild decrease in size of spiculated right upper lobe mass. No definite evidence of metastatic disease within the thorax.  Small hypervascular lesions in left hepatic lobe. Differential diagnosis includes benign etiologies such as flash filling hemangiomas and hypervascular metastases. Abdomen MRI without and with contrast recommended for further characterization.   Electronically Signed   By: Earle Gell M.D.   On: 01/18/2014 13:23    Impression/Plan: Discussed CT results.  Good response to lung SBRT. For liver, I will order MRI in near future and call w/ results.  CT chest and followup in 3 mo. He is pleased w/ this plan.   _____________________________________   Eppie Gibson, MD

## 2014-01-18 NOTE — Progress Notes (Signed)
Daniel Hooper here today for reassessment s/p radiation therapy to his right upper lobe.  He has continuous oxygen delivery at 2 liters per minute.  His O2 Sat is 100% presently.  He denies any pain.  Continues to live with his sister.

## 2014-01-21 ENCOUNTER — Telehealth: Payer: Self-pay | Admitting: *Deleted

## 2014-01-21 ENCOUNTER — Other Ambulatory Visit: Payer: Self-pay | Admitting: Radiation Oncology

## 2014-01-21 DIAGNOSIS — C3410 Malignant neoplasm of upper lobe, unspecified bronchus or lung: Secondary | ICD-10-CM

## 2014-01-21 NOTE — Telephone Encounter (Signed)
CALLED PATIENT TO INFORM OF MRI TO BE DONE @ [facility] ON - ARRIVAL TIME - 2:45 PM, NPO 4 HRS. PRIOR TO TEST AND HIS LABS, CT AND FU ALL ON 04-26-14 ([facility] AND FU WITH DR. Isidore Moos), LVM FOR A RETURN CALL

## 2014-01-21 NOTE — Telephone Encounter (Signed)
XXXX 

## 2014-01-23 ENCOUNTER — Ambulatory Visit (HOSPITAL_COMMUNITY): Payer: Medicare PPO | Attending: Radiation Oncology

## 2014-01-29 ENCOUNTER — Telehealth: Payer: Self-pay | Admitting: *Deleted

## 2014-01-29 NOTE — Telephone Encounter (Signed)
Called patient to inform of test @ [facility] on 01-31-14 - arrival time - 7:45 am , npo after midnight, lvm for a return call

## 2014-01-30 ENCOUNTER — Telehealth: Payer: Self-pay | Admitting: *Deleted

## 2014-01-30 NOTE — Telephone Encounter (Signed)
Called patient to inform of test for 01-31-14 @ [facility], lvm for a return call

## 2014-01-31 ENCOUNTER — Ambulatory Visit (HOSPITAL_COMMUNITY): Payer: Medicare PPO | Attending: Radiation Oncology

## 2014-03-18 ENCOUNTER — Ambulatory Visit (INDEPENDENT_AMBULATORY_CARE_PROVIDER_SITE_OTHER): Payer: Medicare PPO | Admitting: Cardiovascular Disease

## 2014-03-18 ENCOUNTER — Encounter: Payer: Self-pay | Admitting: Cardiovascular Disease

## 2014-03-18 VITALS — BP 124/69 | HR 67 | Ht 68.0 in | Wt 136.1 lb

## 2014-03-18 DIAGNOSIS — R079 Chest pain, unspecified: Secondary | ICD-10-CM

## 2014-03-18 DIAGNOSIS — I10 Essential (primary) hypertension: Secondary | ICD-10-CM

## 2014-03-18 DIAGNOSIS — I471 Supraventricular tachycardia, unspecified: Secondary | ICD-10-CM

## 2014-03-18 DIAGNOSIS — C3491 Malignant neoplasm of unspecified part of right bronchus or lung: Secondary | ICD-10-CM

## 2014-03-18 DIAGNOSIS — I214 Non-ST elevation (NSTEMI) myocardial infarction: Secondary | ICD-10-CM

## 2014-03-18 DIAGNOSIS — R000 Tachycardia, unspecified: Secondary | ICD-10-CM

## 2014-03-18 NOTE — Progress Notes (Signed)
Patient ID: Daniel Hooper, male   DOB: 1945/06/05, 68 y.o.   MRN: [id number]      SUBJECTIVE: The patient presents for follow-up of hypertension, SVT, sinus tachycardia, and CAD. He had a non-STEMI with a very mild troponin elevation 0.08 in the setting of sinus tachycardia in the past. In March 2014, his echocardiogram reportedly revealed normal left ventricular systolic function, diastolic dysfunction, and mild inferobasal hypokinesis. A stress test on 07/31/2012 showed a small area of inferoseptal scar with no evidence of ischemia, and was deemed a low risk study. He also has COPD and right upper lobe adenocarcinoma and received radiation therapy. He has been experiencing a constant chest pressure across both sides of his chest for the past 3 weeks. It is not aggravated by position, breathing, or exertion. He denies palpitations and felt better from this standpoint after starting Coreg at his last visit.  ECG performed in the office today demonstrates normal sinus rhythm with no ischemic ST-T abnormalities.  Chest CT (01/18/14): Mild decrease in size of spiculated right upper lobe mass. No  definite evidence of metastatic disease within the thorax.    Review of Systems: As per "subjective", otherwise negative.  No Known Allergies  Current Outpatient Prescriptions  Medication Sig Dispense Refill  . aspirin 81 MG chewable tablet Chew 81 mg by mouth daily.      . budesonide-formoterol (SYMBICORT) 160-4.5 MCG/ACT inhaler Inhale 2 puffs into the lungs 2 (two) times daily.      . carvedilol (COREG) 6.25 MG tablet Take 1 tablet (6.25 mg total) by mouth 2 (two) times daily.  60 tablet  6  . ipratropium-albuterol (DUONEB) 0.5-2.5 (3) MG/3ML SOLN Take 3 mLs by nebulization every 6 (six) hours.       . montelukast (SINGULAIR) 10 MG tablet Take 10 mg by mouth at bedtime.      . nitroGLYCERIN (NITROSTAT) 0.4 MG SL tablet Place 1 tablet (0.4 mg total) under the tongue every 5 (five) minutes as needed  for chest pain. (Not more than 3 in 15 minute time frame, if no relief - proceed to ED)  25 tablet  3  . OXYGEN-HELIUM IN Inhale into the lungs at bedtime as needed.      . simvastatin (ZOCOR) 20 MG tablet Take 20 mg by mouth daily at 6 PM.       . SPIRIVA HANDIHALER 18 MCG inhalation capsule Place 18 mcg into inhaler and inhale daily.       . VENTOLIN HFA 108 (90 BASE) MCG/ACT inhaler Inhale 2 puffs into the lungs every 6 (six) hours as needed for shortness of breath.        No current facility-administered medications for this visit.    Past Medical History  Diagnosis Date  . Hypoxic     History of hypoxic respiratory failure  . Sinus tachycardia   . Palpitations   . Non-ST elevated myocardial infarction (non-STEMI)   . Hypertension   . COPD (chronic obstructive pulmonary disease)     History of tobacco use, O2 - 2 liters per min., uses on & off   . Shortness of breath   . H/O echocardiogram 2013  . Lung cancer 2015  . S/P radiation therapy 10/01/2013, 10/03/2013, 10/05/2013, 10/08/2013, 10/10/2013    Right upper lung / 50 Gy in 5 fractions    Past Surgical History  Procedure Laterality Date  . None    . Multiple extractions with alveoloplasty N/A 03/20/2013    Procedure: MULTIPLE EXTRACION 8,  9, 11, 13, 14, 15, 16, 18, 19, 20, 21, 23, 24, 25, 26, 27, 28, 29, 30 WITH ALVEOLOPLASTY X 4, BILATERAL TORI;  Surgeon: Gae Bon, DDS;  Location: [facility];  Service: Oral Surgery;  Laterality: N/A;    History   Social History  . Marital Status: Single    Spouse Name: N/A    Number of Children: N/A  . Years of Education: N/A   Occupational History  . Not on file.   Social History Main Topics  . Smoking status: Former Smoker -- 1.00 packs/day for 30 years    Types: Cigarettes    Quit date: 05/24/2006  . Smokeless tobacco: Never Used  . Alcohol Use: No     Comment: quit- 2012- since need O2 for breathing   . Drug Use: No  . Sexual Activity: Not on file   Other Topics Concern    . Not on file   Social History Narrative  . No narrative on file     Filed Vitals:   03/18/14 1201  BP: 124/69  Pulse: 67  Height: 5\' 8"  (1.727 m)  Weight: 136 lb 1.9 oz (61.744 kg)  SpO2: 100%    PHYSICAL EXAM General: NAD, using oxygen by nasal cannula  Neck: No JVD, no thyromegaly.  Lungs: Clear but diminished to auscultation bilaterally with reduced respiratory effort.  CV: Nondisplaced PMI. Regular rate and rhythm, normal S1/S2, no S3/S4, no murmur. No pretibial or periankle edema. No carotid bruit. Normal pedal pulses.  Abdomen: Soft, nontender, no hepatosplenomegaly, no distention.  Neurologic: Alert and oriented x 3.  Psych: Normal affect. Skin: Normal. Musculoskeletal: Normal range of motion, no gross deformities. Extremities: No clubbing or cyanosis.   ECG: Most recent ECG reviewed.      ASSESSMENT AND PLAN: 1. SVT: No further recurrences. Continue Coreg. 2. Sinus tachycardia: Controlled with Coreg, HR 67 bpm today. 3. Chest pain in the setting of CAD and h/o NSTEMI vs demand ischemia: He has normal left ventricular systolic function as per an echocardiogram in March 2014, and also had a low risk nuclear stress test at that time. His troponin was only mildly elevated at .08 in the setting of sinus tachycardia. No noninvasive testing is warranted at this time. He denies exertional chest pain but has constant chest pressure at rest x 3 weeks. I do not feel this is cardiac in etiology. ECG is normal. 4. Essential hypertension: Well controlled on carvedilol 6.25 mg bid. 5. RUL adeno CA s/p radiation therapy: Stable. Will f/u with his oncologist.  Dispo: f/u  6 months.  Kate Sable, M.D., F.A.C.C.

## 2014-03-18 NOTE — Patient Instructions (Signed)
Continue all current medications. Your physician wants you to follow up in: 6 months.  You will receive a reminder letter in the mail one-two months in advance.  If you don't receive a letter, please call our office to schedule the follow up appointment   

## 2014-04-25 ENCOUNTER — Other Ambulatory Visit: Payer: Self-pay | Admitting: *Deleted

## 2014-04-25 ENCOUNTER — Telehealth: Payer: Self-pay | Admitting: *Deleted

## 2014-04-25 MED ORDER — CARVEDILOL 6.25 MG PO TABS
6.2500 mg | ORAL_TABLET | Freq: Two times a day (BID) | ORAL | Status: DC
Start: 2014-04-25 — End: 2014-12-03

## 2014-04-25 NOTE — Telephone Encounter (Signed)
Called patient to ask question, lvm for a return call

## 2014-04-26 ENCOUNTER — Telehealth: Payer: Self-pay | Admitting: *Deleted

## 2014-04-26 ENCOUNTER — Ambulatory Visit: Payer: Medicare PPO | Admitting: Radiation Oncology

## 2014-04-26 ENCOUNTER — Ambulatory Visit: Admission: RE | Admit: 2014-04-26 | Payer: Medicare PPO | Source: Ambulatory Visit

## 2014-04-26 ENCOUNTER — Ambulatory Visit (HOSPITAL_COMMUNITY): Admission: RE | Admit: 2014-04-26 | Payer: Medicare PPO | Source: Ambulatory Visit

## 2014-04-26 NOTE — Telephone Encounter (Signed)
Called patient to ask question, lvm for a return call

## 2014-05-27 DIAGNOSIS — Z79899 Other long term (current) drug therapy: Secondary | ICD-10-CM | POA: Diagnosis not present

## 2014-05-27 DIAGNOSIS — I10 Essential (primary) hypertension: Secondary | ICD-10-CM | POA: Diagnosis not present

## 2014-05-27 DIAGNOSIS — N400 Enlarged prostate without lower urinary tract symptoms: Secondary | ICD-10-CM | POA: Diagnosis not present

## 2014-05-27 DIAGNOSIS — Z7982 Long term (current) use of aspirin: Secondary | ICD-10-CM | POA: Diagnosis not present

## 2014-05-27 DIAGNOSIS — I480 Paroxysmal atrial fibrillation: Secondary | ICD-10-CM | POA: Diagnosis not present

## 2014-05-27 DIAGNOSIS — Z923 Personal history of irradiation: Secondary | ICD-10-CM | POA: Diagnosis not present

## 2014-05-27 DIAGNOSIS — J441 Chronic obstructive pulmonary disease with (acute) exacerbation: Secondary | ICD-10-CM | POA: Diagnosis not present

## 2014-05-27 DIAGNOSIS — C3491 Malignant neoplasm of unspecified part of right bronchus or lung: Secondary | ICD-10-CM | POA: Diagnosis not present

## 2014-05-27 DIAGNOSIS — R0600 Dyspnea, unspecified: Secondary | ICD-10-CM | POA: Diagnosis not present

## 2014-05-28 DIAGNOSIS — J441 Chronic obstructive pulmonary disease with (acute) exacerbation: Secondary | ICD-10-CM | POA: Diagnosis not present

## 2014-05-28 DIAGNOSIS — Z923 Personal history of irradiation: Secondary | ICD-10-CM | POA: Diagnosis not present

## 2014-05-28 DIAGNOSIS — J449 Chronic obstructive pulmonary disease, unspecified: Secondary | ICD-10-CM | POA: Diagnosis not present

## 2014-05-28 DIAGNOSIS — N400 Enlarged prostate without lower urinary tract symptoms: Secondary | ICD-10-CM | POA: Diagnosis not present

## 2014-05-28 DIAGNOSIS — R0600 Dyspnea, unspecified: Secondary | ICD-10-CM | POA: Diagnosis not present

## 2014-05-28 DIAGNOSIS — I10 Essential (primary) hypertension: Secondary | ICD-10-CM | POA: Diagnosis not present

## 2014-05-28 DIAGNOSIS — R069 Unspecified abnormalities of breathing: Secondary | ICD-10-CM | POA: Diagnosis not present

## 2014-05-28 DIAGNOSIS — R0602 Shortness of breath: Secondary | ICD-10-CM | POA: Diagnosis not present

## 2014-05-28 DIAGNOSIS — Z7982 Long term (current) use of aspirin: Secondary | ICD-10-CM | POA: Diagnosis not present

## 2014-05-28 DIAGNOSIS — I480 Paroxysmal atrial fibrillation: Secondary | ICD-10-CM | POA: Diagnosis not present

## 2014-05-28 DIAGNOSIS — Z79899 Other long term (current) drug therapy: Secondary | ICD-10-CM | POA: Diagnosis not present

## 2014-05-28 DIAGNOSIS — C3491 Malignant neoplasm of unspecified part of right bronchus or lung: Secondary | ICD-10-CM | POA: Diagnosis not present

## 2014-05-29 DIAGNOSIS — C3491 Malignant neoplasm of unspecified part of right bronchus or lung: Secondary | ICD-10-CM | POA: Diagnosis not present

## 2014-05-29 DIAGNOSIS — Z923 Personal history of irradiation: Secondary | ICD-10-CM | POA: Diagnosis not present

## 2014-05-29 DIAGNOSIS — I480 Paroxysmal atrial fibrillation: Secondary | ICD-10-CM | POA: Diagnosis not present

## 2014-05-29 DIAGNOSIS — J441 Chronic obstructive pulmonary disease with (acute) exacerbation: Secondary | ICD-10-CM | POA: Diagnosis not present

## 2014-05-29 DIAGNOSIS — Z7982 Long term (current) use of aspirin: Secondary | ICD-10-CM | POA: Diagnosis not present

## 2014-05-29 DIAGNOSIS — N400 Enlarged prostate without lower urinary tract symptoms: Secondary | ICD-10-CM | POA: Diagnosis not present

## 2014-05-29 DIAGNOSIS — R0600 Dyspnea, unspecified: Secondary | ICD-10-CM | POA: Diagnosis not present

## 2014-05-29 DIAGNOSIS — Z79899 Other long term (current) drug therapy: Secondary | ICD-10-CM | POA: Diagnosis not present

## 2014-05-29 DIAGNOSIS — I10 Essential (primary) hypertension: Secondary | ICD-10-CM | POA: Diagnosis not present

## 2014-05-30 DIAGNOSIS — I10 Essential (primary) hypertension: Secondary | ICD-10-CM | POA: Diagnosis not present

## 2014-05-30 DIAGNOSIS — C3491 Malignant neoplasm of unspecified part of right bronchus or lung: Secondary | ICD-10-CM | POA: Diagnosis not present

## 2014-05-30 DIAGNOSIS — Z923 Personal history of irradiation: Secondary | ICD-10-CM | POA: Diagnosis not present

## 2014-05-30 DIAGNOSIS — R0600 Dyspnea, unspecified: Secondary | ICD-10-CM | POA: Diagnosis not present

## 2014-05-30 DIAGNOSIS — J441 Chronic obstructive pulmonary disease with (acute) exacerbation: Secondary | ICD-10-CM | POA: Diagnosis not present

## 2014-05-30 DIAGNOSIS — I480 Paroxysmal atrial fibrillation: Secondary | ICD-10-CM | POA: Diagnosis not present

## 2014-05-30 DIAGNOSIS — Z7982 Long term (current) use of aspirin: Secondary | ICD-10-CM | POA: Diagnosis not present

## 2014-05-30 DIAGNOSIS — N400 Enlarged prostate without lower urinary tract symptoms: Secondary | ICD-10-CM | POA: Diagnosis not present

## 2014-05-30 DIAGNOSIS — Z79899 Other long term (current) drug therapy: Secondary | ICD-10-CM | POA: Diagnosis not present

## 2014-05-31 DIAGNOSIS — J449 Chronic obstructive pulmonary disease, unspecified: Secondary | ICD-10-CM | POA: Diagnosis not present

## 2014-06-04 DIAGNOSIS — C3411 Malignant neoplasm of upper lobe, right bronchus or lung: Secondary | ICD-10-CM | POA: Diagnosis not present

## 2014-06-04 DIAGNOSIS — J449 Chronic obstructive pulmonary disease, unspecified: Secondary | ICD-10-CM | POA: Diagnosis not present

## 2014-06-04 DIAGNOSIS — Z9981 Dependence on supplemental oxygen: Secondary | ICD-10-CM | POA: Diagnosis not present

## 2014-06-04 DIAGNOSIS — Z923 Personal history of irradiation: Secondary | ICD-10-CM | POA: Diagnosis not present

## 2014-07-01 DIAGNOSIS — R069 Unspecified abnormalities of breathing: Secondary | ICD-10-CM | POA: Diagnosis not present

## 2014-07-01 DIAGNOSIS — J449 Chronic obstructive pulmonary disease, unspecified: Secondary | ICD-10-CM | POA: Diagnosis not present

## 2014-07-02 DIAGNOSIS — J439 Emphysema, unspecified: Secondary | ICD-10-CM | POA: Diagnosis not present

## 2014-07-02 DIAGNOSIS — I10 Essential (primary) hypertension: Secondary | ICD-10-CM | POA: Diagnosis not present

## 2014-07-02 DIAGNOSIS — J449 Chronic obstructive pulmonary disease, unspecified: Secondary | ICD-10-CM | POA: Diagnosis not present

## 2014-07-02 DIAGNOSIS — N400 Enlarged prostate without lower urinary tract symptoms: Secondary | ICD-10-CM | POA: Diagnosis not present

## 2014-07-02 DIAGNOSIS — J441 Chronic obstructive pulmonary disease with (acute) exacerbation: Secondary | ICD-10-CM | POA: Diagnosis not present

## 2014-07-02 DIAGNOSIS — Z7982 Long term (current) use of aspirin: Secondary | ICD-10-CM | POA: Diagnosis not present

## 2014-07-02 DIAGNOSIS — C3491 Malignant neoplasm of unspecified part of right bronchus or lung: Secondary | ICD-10-CM | POA: Diagnosis not present

## 2014-07-02 DIAGNOSIS — Z85118 Personal history of other malignant neoplasm of bronchus and lung: Secondary | ICD-10-CM | POA: Diagnosis not present

## 2014-07-02 DIAGNOSIS — Z9981 Dependence on supplemental oxygen: Secondary | ICD-10-CM | POA: Diagnosis not present

## 2014-07-02 DIAGNOSIS — R0602 Shortness of breath: Secondary | ICD-10-CM | POA: Diagnosis not present

## 2014-07-02 DIAGNOSIS — J984 Other disorders of lung: Secondary | ICD-10-CM | POA: Diagnosis not present

## 2014-07-02 DIAGNOSIS — I480 Paroxysmal atrial fibrillation: Secondary | ICD-10-CM | POA: Diagnosis not present

## 2014-07-02 DIAGNOSIS — E780 Pure hypercholesterolemia: Secondary | ICD-10-CM | POA: Diagnosis not present

## 2014-07-02 DIAGNOSIS — J9621 Acute and chronic respiratory failure with hypoxia: Secondary | ICD-10-CM | POA: Diagnosis not present

## 2014-07-03 DIAGNOSIS — J441 Chronic obstructive pulmonary disease with (acute) exacerbation: Secondary | ICD-10-CM | POA: Diagnosis not present

## 2014-07-24 DIAGNOSIS — Z923 Personal history of irradiation: Secondary | ICD-10-CM | POA: Diagnosis not present

## 2014-07-24 DIAGNOSIS — C3411 Malignant neoplasm of upper lobe, right bronchus or lung: Secondary | ICD-10-CM | POA: Diagnosis not present

## 2014-07-24 DIAGNOSIS — J438 Other emphysema: Secondary | ICD-10-CM | POA: Diagnosis not present

## 2014-07-30 DIAGNOSIS — J449 Chronic obstructive pulmonary disease, unspecified: Secondary | ICD-10-CM | POA: Diagnosis not present

## 2014-08-07 DIAGNOSIS — G4700 Insomnia, unspecified: Secondary | ICD-10-CM | POA: Diagnosis not present

## 2014-08-07 DIAGNOSIS — J441 Chronic obstructive pulmonary disease with (acute) exacerbation: Secondary | ICD-10-CM | POA: Diagnosis not present

## 2014-08-23 DIAGNOSIS — J9809 Other diseases of bronchus, not elsewhere classified: Secondary | ICD-10-CM | POA: Diagnosis not present

## 2014-08-23 DIAGNOSIS — C3490 Malignant neoplasm of unspecified part of unspecified bronchus or lung: Secondary | ICD-10-CM | POA: Diagnosis not present

## 2014-08-23 DIAGNOSIS — K7689 Other specified diseases of liver: Secondary | ICD-10-CM | POA: Diagnosis not present

## 2014-08-23 DIAGNOSIS — Z01812 Encounter for preprocedural laboratory examination: Secondary | ICD-10-CM | POA: Diagnosis not present

## 2014-08-23 DIAGNOSIS — J432 Centrilobular emphysema: Secondary | ICD-10-CM | POA: Diagnosis not present

## 2014-08-27 DIAGNOSIS — J449 Chronic obstructive pulmonary disease, unspecified: Secondary | ICD-10-CM | POA: Diagnosis not present

## 2014-08-27 DIAGNOSIS — Z923 Personal history of irradiation: Secondary | ICD-10-CM | POA: Diagnosis not present

## 2014-08-27 DIAGNOSIS — Z9981 Dependence on supplemental oxygen: Secondary | ICD-10-CM | POA: Diagnosis not present

## 2014-08-27 DIAGNOSIS — C3411 Malignant neoplasm of upper lobe, right bronchus or lung: Secondary | ICD-10-CM | POA: Diagnosis not present

## 2014-08-30 DIAGNOSIS — J449 Chronic obstructive pulmonary disease, unspecified: Secondary | ICD-10-CM | POA: Diagnosis not present

## 2014-09-29 DIAGNOSIS — J449 Chronic obstructive pulmonary disease, unspecified: Secondary | ICD-10-CM | POA: Diagnosis not present

## 2014-10-11 ENCOUNTER — Encounter: Payer: Self-pay | Admitting: *Deleted

## 2014-10-11 ENCOUNTER — Encounter: Payer: Self-pay | Admitting: Cardiovascular Disease

## 2014-10-11 ENCOUNTER — Ambulatory Visit (INDEPENDENT_AMBULATORY_CARE_PROVIDER_SITE_OTHER): Payer: Commercial Managed Care - HMO | Admitting: Cardiovascular Disease

## 2014-10-11 VITALS — BP 110/64 | HR 88 | Ht 68.0 in | Wt 134.0 lb

## 2014-10-11 DIAGNOSIS — I471 Supraventricular tachycardia: Secondary | ICD-10-CM

## 2014-10-11 DIAGNOSIS — I2510 Atherosclerotic heart disease of native coronary artery without angina pectoris: Secondary | ICD-10-CM | POA: Diagnosis not present

## 2014-10-11 DIAGNOSIS — I10 Essential (primary) hypertension: Secondary | ICD-10-CM

## 2014-10-11 DIAGNOSIS — C3491 Malignant neoplasm of unspecified part of right bronchus or lung: Secondary | ICD-10-CM

## 2014-10-11 DIAGNOSIS — I214 Non-ST elevation (NSTEMI) myocardial infarction: Secondary | ICD-10-CM | POA: Diagnosis not present

## 2014-10-11 DIAGNOSIS — R000 Tachycardia, unspecified: Secondary | ICD-10-CM

## 2014-10-11 NOTE — Patient Instructions (Signed)
Continue all current medications. Your physician wants you to follow up in:  1 year.  You will receive a reminder letter in the mail one-two months in advance.  If you don't receive a letter, please call our office to schedule the follow up appointment   

## 2014-10-11 NOTE — Progress Notes (Signed)
Patient ID: Daniel Hooper, male   DOB: 06-Jul-1945, 69 y.o.   MRN: [id number]      SUBJECTIVE: The patient presents for follow-up of hypertension, SVT, sinus tachycardia, and CAD. He had a non-STEMI with a very mild troponin elevation 0.08 in the setting of sinus tachycardia in the past. In March 2014, his echocardiogram reportedly revealed normal left ventricular systolic function, diastolic dysfunction, and mild inferobasal hypokinesis. A stress test on 07/31/2012 showed a small area of inferoseptal scar with no evidence of ischemia, and was deemed a low risk study. He also has COPD and right upper lobe adenocarcinoma and received radiation therapy. He said he was hospitalized at [facility] in February for wheezing. He said he did not have a heart attack or congestive heart failure. He currently denies chest pain and palpitations. His chronic exertional dyspnea is no worse from baseline. He denies leg swelling. He is scheduled to see his oncologist, Dr. Whitney Muse, in June.    Review of Systems: As per "subjective", otherwise negative.  No Known Allergies  Current Outpatient Prescriptions  Medication Sig Dispense Refill  . aspirin 81 MG chewable tablet Chew 81 mg by mouth daily.    . budesonide-formoterol (SYMBICORT) 160-4.5 MCG/ACT inhaler Inhale 2 puffs into the lungs 2 (two) times daily.    . carvedilol (COREG) 6.25 MG tablet Take 1 tablet (6.25 mg total) by mouth 2 (two) times daily. 60 tablet 6  . ipratropium-albuterol (DUONEB) 0.5-2.5 (3) MG/3ML SOLN Take 3 mLs by nebulization every 6 (six) hours.     . montelukast (SINGULAIR) 10 MG tablet Take 10 mg by mouth at bedtime.    . nitroGLYCERIN (NITROSTAT) 0.4 MG SL tablet Place 1 tablet (0.4 mg total) under the tongue every 5 (five) minutes as needed for chest pain. (Not more than 3 in 15 minute time frame, if no relief - proceed to ED) 25 tablet 3  . OXYGEN-HELIUM IN Inhale into the lungs at bedtime as needed.    . simvastatin (ZOCOR) 20 MG  tablet Take 20 mg by mouth daily at 6 PM.     . SPIRIVA HANDIHALER 18 MCG inhalation capsule Place 18 mcg into inhaler and inhale daily.     . VENTOLIN HFA 108 (90 BASE) MCG/ACT inhaler Inhale 2 puffs into the lungs every 6 (six) hours as needed for shortness of breath.      No current facility-administered medications for this visit.    Past Medical History  Diagnosis Date  . Hypoxic     History of hypoxic respiratory failure  . Sinus tachycardia   . Palpitations   . Non-ST elevated myocardial infarction (non-STEMI)   . Hypertension   . COPD (chronic obstructive pulmonary disease)     History of tobacco use, O2 - 2 liters per min., uses on & off   . Shortness of breath   . H/O echocardiogram 2013  . Lung cancer 2015  . S/P radiation therapy 10/01/2013, 10/03/2013, 10/05/2013, 10/08/2013, 10/10/2013    Right upper lung / 50 Gy in 5 fractions    Past Surgical History  Procedure Laterality Date  . None    . Multiple extractions with alveoloplasty N/A 03/20/2013    Procedure: MULTIPLE EXTRACION 8, 9, 11, 13, 14, 15, 16, 18, 19, 20, 21, 23, 24, 25, 26, 27, 28, 29, 30 WITH ALVEOLOPLASTY X 4, BILATERAL TORI;  Surgeon: Gae Bon, DDS;  Location: [facility];  Service: Oral Surgery;  Laterality: N/A;    History   Social History  .  Marital Status: Single    Spouse Name: N/A  . Number of Children: N/A  . Years of Education: N/A   Occupational History  . Not on file.   Social History Main Topics  . Smoking status: Former Smoker -- 1.00 packs/day for 30 years    Types: Cigarettes    Quit date: 05/24/2006  . Smokeless tobacco: Never Used  . Alcohol Use: No     Comment: quit- 2012- since need O2 for breathing   . Drug Use: No  . Sexual Activity: Not on file   Other Topics Concern  . Not on file   Social History Narrative     Filed Vitals:   10/11/14 1544  BP: 110/64  Pulse: 88  Height: '5\' 8"'$  (1.727 m)  Weight: 134 lb (60.782 kg)  SpO2: 96%    PHYSICAL EXAM General:  NAD, using oxygen by nasal cannula  Neck: No JVD, no thyromegaly.  Lungs: Clear but diminished to auscultation bilaterally with reduced respiratory effort.  CV: Nondisplaced PMI. Regular rate and rhythm, normal S1/S2, no S3/S4, no murmur. No pretibial or periankle edema.   Abdomen: Soft, nontender, no distention.  Neurologic: Alert and oriented x 3.  Psych: Normal affect. Skin: Normal. Musculoskeletal: No gross deformities. Extremities: No clubbing or cyanosis.    ECG: Most recent ECG reviewed.      ASSESSMENT AND PLAN: 1. SVT: No further recurrences. Continue Coreg.  2. Sinus tachycardia: Controlled with Coreg, HR 88 bpm today.  3. CAD and h/o NSTEMI vs demand ischemia: Symptomatically stable on current therapy. Normal left ventricular systolic function as per echocardiogram in March 2014, and also had a low risk nuclear stress test at that time. His troponin was only mildly elevated at .08 in the setting of sinus tachycardia. No noninvasive testing is warranted at this time.   4. Essential hypertension: Well controlled on carvedilol 6.25 mg bid. No changes.  5. RUL adeno CA s/p radiation therapy: Stable. Will f/u with his oncologist, Dr. Whitney Muse, in June.  Dispo: f/u1 year.   Kate Sable, M.D., F.A.C.C.

## 2014-12-03 ENCOUNTER — Other Ambulatory Visit: Payer: Self-pay | Admitting: Cardiovascular Disease

## 2014-12-25 ENCOUNTER — Other Ambulatory Visit: Payer: Self-pay | Admitting: Radiation Oncology

## 2014-12-25 DIAGNOSIS — C3492 Malignant neoplasm of unspecified part of left bronchus or lung: Secondary | ICD-10-CM

## 2015-01-21 ENCOUNTER — Telehealth: Payer: Self-pay | Admitting: *Deleted

## 2015-01-21 NOTE — Telephone Encounter (Signed)
Called patient to inform of Pet Scan for 01-23-15, lvm for a return call

## 2015-01-23 ENCOUNTER — Ambulatory Visit (HOSPITAL_COMMUNITY)
Admission: RE | Admit: 2015-01-23 | Discharge: 2015-01-23 | Disposition: A | Discharge status: Home / Self Care | Payer: Medicare HMO | Source: Ambulatory Visit | Attending: Radiation Oncology | Admitting: Radiation Oncology

## 2015-01-23 DIAGNOSIS — C3492 Malignant neoplasm of unspecified part of left bronchus or lung: Secondary | ICD-10-CM

## 2015-01-23 DIAGNOSIS — Z923 Personal history of irradiation: Secondary | ICD-10-CM | POA: Diagnosis not present

## 2015-01-23 LAB — GLUCOSE, CAPILLARY: Glucose-Capillary: 91 mg/dL (ref 65–99)

## 2015-01-23 MED ORDER — FLUDEOXYGLUCOSE F - 18 (FDG) INJECTION
6.6000 | Freq: Once | INTRAVENOUS | Status: DC | PRN
  Administered 2015-01-23: 6.6 via INTRAVENOUS
  Filled 2015-01-23: qty 6.6

## 2015-02-04 ENCOUNTER — Other Ambulatory Visit: Payer: Self-pay | Admitting: Radiation Oncology

## 2015-02-11 ENCOUNTER — Telehealth: Payer: Self-pay | Admitting: Family Medicine

## 2015-02-14 ENCOUNTER — Ambulatory Visit
Admission: RE | Admit: 2015-02-14 | Discharge: 2015-02-14 | Disposition: A | Discharge status: Home / Self Care | Payer: Medicare HMO | Source: Ambulatory Visit | Attending: Radiation Oncology | Admitting: Radiation Oncology

## 2015-02-14 ENCOUNTER — Encounter: Payer: Self-pay | Admitting: Radiation Oncology

## 2015-02-14 VITALS — BP 133/82 | HR 69

## 2015-02-14 DIAGNOSIS — C3411 Malignant neoplasm of upper lobe, right bronchus or lung: Secondary | ICD-10-CM

## 2015-02-14 NOTE — Progress Notes (Signed)
Radiation Oncology         [phone] ________________________________  Name: Daniel Hooper MRN: [id number]  Date: 02/14/2015  DOB: 1946/04/08  Follow-Up Visit Note  Outpatient  CC: Deloria Lair, MD  Everardo All, MD  Diagnosis and Prior Radiotherapy:    ICD-9-CM ICD-10-CM   1. Primary malignant neoplasm of right upper lobe of lung 162.3 C34.11    T2aN0M0 Adenocarcinoma, Right Upper Lung  Indication for treatment: curative  Radiation treatment dates: 10/01/2013, 10/03/2013, 10/05/2013, 10/08/2013, 10/10/2013  Site/dose: Right upper lung / 50 Gy in 5 fractions  Narrative:  The patient returns today for routine follow-up appointment with radiation oncology. The patient was recently seen at [facility], which is closer to his home. In the interim, I ordered a PET scan for an enlarging right upper lobe mass. The right upper lobe mass is mildly hypermetabolic and suggestive of a local reoccurrence after SBRT. The right peritracheal node is not appear to have progressed in any significant way over the past year and a half. He does have several ground glass nodules that warrant surveillance in the right lung. He has a nondisplaced fracture of the right fourth rib. The patient was discussed at tumor board. Biopsy of his region is felt to be very high risk and is not reccommended. The recommendation was to pursue salvage radiotherapy. The patient requires nasal canula oxygen for COPD. He denies symptoms of ataxia, headaches, blurred vision or unsteady gait. He will be given about a week to arrange for transportation to his future radiation treatments. The patient projected a health mental status and was not accompanied by family for today's radiation oncology visit.                            ALLERGIES:  has No Known Allergies.  Meds: Current Outpatient Prescriptions  Medication Sig Dispense Refill  . aspirin 81 MG chewable tablet Chew 81 mg by mouth daily.    . budesonide-formoterol  (SYMBICORT) 160-4.5 MCG/ACT inhaler Inhale 2 puffs into the lungs 2 (two) times daily.    . carvedilol (COREG) 6.25 MG tablet TAKE ONE TABLET BY MOUTH TWICE DAILY 60 tablet 6  . ipratropium-albuterol (DUONEB) 0.5-2.5 (3) MG/3ML SOLN Take 3 mLs by nebulization every 6 (six) hours.     . montelukast (SINGULAIR) 10 MG tablet Take 10 mg by mouth at bedtime.    . nitroGLYCERIN (NITROSTAT) 0.4 MG SL tablet Place 1 tablet (0.4 mg total) under the tongue every 5 (five) minutes as needed for chest pain. (Not more than 3 in 15 minute time frame, if no relief - proceed to ED) 25 tablet 3  . OXYGEN-HELIUM IN Inhale into the lungs at bedtime as needed.    . simvastatin (ZOCOR) 20 MG tablet Take 20 mg by mouth daily at 6 PM.     . SPIRIVA HANDIHALER 18 MCG inhalation capsule Place 18 mcg into inhaler and inhale daily.     . VENTOLIN HFA 108 (90 BASE) MCG/ACT inhaler Inhale 2 puffs into the lungs every 6 (six) hours as needed for shortness of breath.      No current facility-administered medications for this encounter.    Physical Findings: The patient is in no acute distress. Patient is alert and oriented x3.  blood pressure is 133/82 and his pulse is 69. His oxygen saturation is 100%.   Lungs are clear to auscultation bilaterally with some decreased breath sounds throughout. Heart has regular rate  and rhythm. No palpable masses in the cervical or supraclavicular regions No swelling of the upper or lower extremities   Lab Findings: Lab Results  Component Value Date   WBC 7.8 03/15/2013   HGB 15.5 03/15/2013   HCT 43.7 03/15/2013   MCV 93.0 03/15/2013   PLT 278 03/15/2013    Radiographic Findings: Nm Pet Image Restag (ps) Skull Base To Thigh  01/23/2015   CLINICAL DATA:  Subsequent treatment strategy for left-sided lung cancer. Restaging examination. Radiation therapy completed 10/10/2013.  EXAM: NUCLEAR MEDICINE PET SKULL BASE TO THIGH  TECHNIQUE: 6.6 mCi F-18 FDG was injected intravenously.  Full-ring PET imaging was performed from the skull base to thigh after the radiotracer. CT data was obtained and used for attenuation correction and anatomic localization.  FASTING BLOOD GLUCOSE:  Value: 90 for mg/dl  COMPARISON:  Multiple priors, most recently chest CT 12/15/2014. PET-CT 09/05/2013.  FINDINGS: NECK  No hypermetabolic lymph nodes in the neck.  CHEST  Again noted is a mass-like area of architectural distortion in the periphery of the right upper lobe, which is at the site of the previously treated right upper lobe adenocarcinoma. The bulkiest portion of this region measures 2.4 x 3.2 cm on today's examination (image 24 of series 6), which appears similar to the most recent prior study, but has clearly enlarged compared to 11/01/2014. More inferior to this lesion (image 27 of series 6) there is a bulkier appearing area of septal thickening, nodularity and architectural distortion, which overall measures 2.9 x 1.5 cm. This entire region in the right upper lobe is hypermetabolic (SUVmax = 3.3, suggesting recurrent disease. Multiple tiny satellite micronodules are noted throughout the periphery of the right upper lobe, which could be tumor, or could be post obstructive changes. A few other scattered ground-glass attenuation nodules are also noted in the right lung, largest of which is an 8 mm ground-glass attenuation nodule anterior right middle lobe (image 36 of series 6), which appears new compared to prior study 04/16/2014, but is similar to the more recent prior examinations. No confluent consolidative airspace disease. No pleural effusions. Mild diffuse bronchial wall thickening with moderate centrilobular emphysema. Nonenlarged low right paratracheal lymph node measuring 7 mm in short axis demonstrates hypermetabolism (SUVmax = 3.1). No other definite hypermetabolic hilar or mediastinal lymph nodes are noted. Heart size is normal. There is no significant pericardial fluid, thickening or pericardial  calcification. There is atherosclerosis of the thoracic aorta, the great vessels of the mediastinum and the coronary arteries, including calcified atherosclerotic plaque in the left main, left anterior descending, left circumflex and right coronary arteries. )  ABDOMEN/PELVIS  No abnormal hypermetabolic activity within the liver, pancreas, adrenal glands, or spleen. No hypermetabolic lymph nodes in the abdomen or pelvis. No significant volume of ascites. No pneumoperitoneum. No pathologic distention of small bowel or colon. Numerous colonic diverticulae are noted, without surrounding inflammatory changes to suggest an acute diverticulitis. Extensive atherosclerosis throughout the abdominal and pelvic vasculature, without evidence of aneurysm.  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 No other focal hypermetabolic activity to suggest skeletal metastasis.  IMPRESSION: 1. Findings are concerning for local recurrence of disease with enlarging masslike and nodular opacities in the right upper lobe which demonstrate hypermetabolism, as well as a low right paratracheal lymph node which is nonenlarged, but  hypermetabolic. In addition, there are several new ground-glass attenuation nodules in the anterior right middle lobe (similar to recent prior examinations, but new compared to more remote prior studies), concerning for multicentric disease. 2. 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 No associated pneumothorax. 3. Mild diffuse bronchial wall thickening with moderate centrilobular emphysema; imaging findings suggestive of underlying COPD. 4.  Atherosclerosis, including left main and 3 vessel coronary artery disease. 5. Colonic diverticulosis without evidence of acute diverticulitis at this time. These results will be called to the ordering clinician or representative by the Radiologist Assistant, and communication documented in the PACS or zVision Dashboard.   Electronically Signed   By: Vinnie Langton M.D.   On: 01/23/2015 16:58    Impression/Plan: He is instructed to contact his PCP and recommended to receive his vaccines and flu shot, due to his vulnerability because of his emphysema. He understands that this is simply a recommendation and not a prerequisite to treatment. The patient understands the benefits of receiving 10 fractions of treatments, as opposed to five fractions of treatments. He also understands why a biopsy is not recommended at this time. He is aware that symptoms from past treatment may reoccur with future treatments, moving forward. The patient understands the benefits, risks, and purpose of re-irradiation therapy were reviewed in detail, including statistics from reputable studies. The patient was educated on common symptoms to expect during radiation treatment and healthy methods of management to address these symptoms if they are to occur. The patient was educated on the traditional process he will experience during radiation therapy treatments, including the purpose and definition of a CT scan, simulation planning session, and the appropriate time frame to set aside daily for treatments.He was sent home with additional educational resources thatshe can access via the Internet he is aware of her CT scan and simulation planning session to take place as scheduled, later today (02/14/2015). He is aware of his follow-up appointment with radiation oncology to take place as scheduled and to notify Dr. Isidore Moos, MD if any symptoms of concern occur. All vocalized questions and concerns have been addressed. If the patient develops any  further questions or concerns in regards to his treatment and recovery, he has been encouraged to contact Dr. Isidore Moos, MD. Consent documentation was reviewed in detail and signed. The patient understands that he can access his appointments and medical records via [facility].    This document serves as a record of services personally performed by Eppie Gibson, MD. It was created on her behalf by Lenn Cal, a trained medical scribe. The creation of this record is based on the scribe's personal observations and the provider's statements to them. This document has been checked and approved by the attending provider. _____________________________________   Eppie Gibson, MD

## 2015-02-14 NOTE — Progress Notes (Addendum)
  Radiation Oncology         [phone] ________________________________  Name: Daniel Hooper MRN: [id number]  Date: 02/14/2015  DOB: Mar 26, 1946  4DCT COMPLEX SIMULATION / TREATMENT PLANNING NOTE / SPECIAL TREATMENT PROCEDURE  Outpatient    ICD-9-CM ICD-10-CM   1. Primary malignant neoplasm of right upper lobe of lung 162.3 C34.11     The patient was positioned on the CT simulator in a complex treatment device custom fitted to their body: A body fix blue bag. The patient's head was in an Accuform.  The patient's arms were over their head. An abdominal compression device was snugly fitted to decrease the patient's intrathoracic movements.   RESPIRATORY MOTION MANAGEMENT SIMULATION  NARRATIVE:  In order to account for effect of respiratory motion on target structures and other organs in the planning and delivery of radiotherapy, this patient underwent respiratory motion management simulation.  To accomplish this, when the patient was brought to the CT simulation planning suite, 4D respiratory motion management CT images were obtained.  The CT images were loaded into the planning software.  Then, using a variety of tools including Cine, MIP, and standard views, the target volume and planning target volumes (PTV) were delineated.  Avoidance structures were contoured.  Treatment planning then occurred.  Dose volume histograms will be generated and reviewed for each of the requested structure.  I contoured the patient's ITV in his right upper lung and increased ITV with tight margins for the PTV. I will prescribe 50 Gy in 10 fractions of 5 Gy per fraction . I requested a dose calculation, IMRT treatment device,  and a DVH of the patient's lungs, target volumes, esophagus, heart, spinal cord and airways for IMRT planning.  Cone beam CT scans will be performed prior to each fraction to allow close PTV margins and sparing of normal tissues from high doses. IMRT is a necessary modality to spare the  patient's chest wall from excessive re-irradiation doses, and to spare lungs, heart, esophagus, spinal cord as well.  Special Treatment Procedure Note: The patient received prior radiotherapy within his current fields. There will be overlap of radiation dose.  Prior regional radiotherapy increases the risk of side effects from treatment. I have considered this in the treatment planning process.  This increases the complexity of this patient's treatment and therefore this constitutes a special treatment procedure.  -----------------------------------  Eppie Gibson, MD

## 2015-02-14 NOTE — Progress Notes (Signed)
Deneis any ataxia, headaches, blurred vision or unsteady gait

## 2015-02-24 DIAGNOSIS — Z79899 Other long term (current) drug therapy: Secondary | ICD-10-CM | POA: Diagnosis not present

## 2015-02-24 DIAGNOSIS — C3411 Malignant neoplasm of upper lobe, right bronchus or lung: Secondary | ICD-10-CM | POA: Diagnosis not present

## 2015-02-24 DIAGNOSIS — Z510 Encounter for antineoplastic radiation therapy: Secondary | ICD-10-CM | POA: Insufficient documentation

## 2015-02-24 NOTE — Addendum Note (Signed)
Encounter addended by: Eppie Gibson, MD on: 02/24/2015  2:45 PM<BR>     Documentation filed: Notes Section

## 2015-02-26 ENCOUNTER — Ambulatory Visit
Admission: RE | Admit: 2015-02-26 | Discharge: 2015-02-26 | Disposition: A | Discharge status: Home / Self Care | Payer: Medicare HMO | Source: Ambulatory Visit | Attending: Radiation Oncology | Admitting: Radiation Oncology

## 2015-02-26 DIAGNOSIS — Z510 Encounter for antineoplastic radiation therapy: Secondary | ICD-10-CM | POA: Diagnosis not present

## 2015-02-26 DIAGNOSIS — C3411 Malignant neoplasm of upper lobe, right bronchus or lung: Secondary | ICD-10-CM

## 2015-02-27 ENCOUNTER — Ambulatory Visit
Admission: RE | Admit: 2015-02-27 | Discharge: 2015-02-27 | Disposition: A | Discharge status: Home / Self Care | Payer: Medicare HMO | Source: Ambulatory Visit | Attending: Radiation Oncology | Admitting: Radiation Oncology

## 2015-02-27 DIAGNOSIS — Z510 Encounter for antineoplastic radiation therapy: Secondary | ICD-10-CM | POA: Diagnosis not present

## 2015-02-28 ENCOUNTER — Ambulatory Visit
Admission: RE | Admit: 2015-02-28 | Discharge: 2015-02-28 | Disposition: A | Discharge status: Home / Self Care | Payer: Medicare HMO | Source: Ambulatory Visit | Attending: Radiation Oncology | Admitting: Radiation Oncology

## 2015-02-28 DIAGNOSIS — Z510 Encounter for antineoplastic radiation therapy: Secondary | ICD-10-CM | POA: Diagnosis not present

## 2015-03-03 ENCOUNTER — Ambulatory Visit
Admission: RE | Admit: 2015-03-03 | Discharge: 2015-03-03 | Disposition: A | Discharge status: Home / Self Care | Payer: Medicare HMO | Source: Ambulatory Visit | Attending: Radiation Oncology | Admitting: Radiation Oncology

## 2015-03-03 ENCOUNTER — Ambulatory Visit: Admission: RE | Admit: 2015-03-03 | Payer: Medicare HMO | Source: Ambulatory Visit | Admitting: Radiation Oncology

## 2015-03-04 ENCOUNTER — Ambulatory Visit
Admission: RE | Admit: 2015-03-04 | Discharge: 2015-03-04 | Disposition: A | Discharge status: Home / Self Care | Payer: Medicare HMO | Source: Ambulatory Visit | Attending: Radiation Oncology | Admitting: Radiation Oncology

## 2015-03-04 ENCOUNTER — Encounter: Payer: Self-pay | Admitting: Radiation Oncology

## 2015-03-04 VITALS — BP 126/68 | HR 82 | Temp 97.7°F | Ht 68.0 in | Wt 140.5 lb

## 2015-03-04 DIAGNOSIS — C3411 Malignant neoplasm of upper lobe, right bronchus or lung: Secondary | ICD-10-CM

## 2015-03-04 DIAGNOSIS — Z510 Encounter for antineoplastic radiation therapy: Secondary | ICD-10-CM | POA: Diagnosis not present

## 2015-03-04 MED ORDER — RADIAPLEXRX EX GEL: Freq: Once | CUTANEOUS | Status: DC

## 2015-03-04 MED ORDER — RADIAPLEXRX EX GEL
Freq: Once | CUTANEOUS | Status: AC
  Administered 2015-03-04: 16:00:00 via TOPICAL

## 2015-03-04 NOTE — Progress Notes (Signed)
Pt here for patient teaching.  Pt given Radiation and You booklet, skin care instructions and Radiaplex gel. Pt reports they have watched the Radiation Therapy Education video on March 04, 2015.  Reviewed areas of pertinence such as fatigue, hair loss, mouth changes, skin changes, throat changes, breast swelling, cough, shortness of breath, earaches and taste changes . Pt able to give teach back of to pat skin, use unscented/gentle soap and drink plenty of water,apply Radiaplex bid and avoid applying anything to skin within 4 hours of treatment. Pt demonstrated understanding of information given and will contact nursing with any questions or concerns.

## 2015-03-04 NOTE — Progress Notes (Signed)
Weekly Management Note:  Site: Right lung Current Dose:  2000  cGy Projected Dose: 5000  cGy  Narrative: The patient is seen today for routine under treatment assessment. CBCT/MVCT images/port films were reviewed. The chart was reviewed.   He did have some dyspnea over the weekend and was hospitalized for 2 days.  He is felt to perhaps have an early pneumonia and was treated with antibiotics.  He is back to his baseline dyspnea on exertion.  Physical Examination: .  Wt Readings from Last 3 Encounters:  03/04/15 140 lb 8 oz (63.73 kg)  10/11/14 134 lb (60.782 kg)  03/18/14 136 lb 1.9 oz (61.744 kg)   Temp Readings from Last 3 Encounters:  03/04/15 97.7 F (36.5 C)   01/18/14 97.7 F (36.5 C)   10/01/13 97.7 F (36.5 C)    BP Readings from Last 3 Encounters:  03/04/15 126/68  02/14/15 133/82  10/11/14 110/64   Pulse Readings from Last 3 Encounters:  03/04/15 82  02/14/15 69  10/11/14 88   Lungs are clear.  No significant skin changes.   Impression: Tolerating radiation therapy well.  Plan: Continue radiation therapy as planned.

## 2015-03-04 NOTE — Progress Notes (Signed)
Mr. Daniel Hooper presents for his 4th fraction of radiation to his lung. He was in the hospital 10/8-10/9 for ? Pneumonia, which he was given an antibiotic for, which he is taking, but unable to name at this time. He states he feels very well now. He denies pain, fatigue, and his skin is intact.  BP 126/68 mmHg  Pulse 82  Temp(Src) 97.7 F (36.5 C)  Ht '5\' 8"'$  (1.727 m)  Wt 140 lb 8 oz (63.73 kg)  BMI 21.37 kg/m2  SpO2 99%  Wt Readings from Last 3 Encounters:  03/04/15 140 lb 8 oz (63.73 kg)  10/11/14 134 lb (60.782 kg)  03/18/14 136 lb 1.9 oz (61.744 kg)

## 2015-03-05 ENCOUNTER — Ambulatory Visit
Admission: RE | Admit: 2015-03-05 | Discharge: 2015-03-05 | Disposition: A | Discharge status: Home / Self Care | Payer: Medicare HMO | Source: Ambulatory Visit | Attending: Radiation Oncology | Admitting: Radiation Oncology

## 2015-03-05 DIAGNOSIS — Z510 Encounter for antineoplastic radiation therapy: Secondary | ICD-10-CM | POA: Diagnosis not present

## 2015-03-06 ENCOUNTER — Ambulatory Visit
Admission: RE | Admit: 2015-03-06 | Discharge: 2015-03-06 | Disposition: A | Discharge status: Home / Self Care | Payer: Medicare HMO | Source: Ambulatory Visit | Attending: Radiation Oncology | Admitting: Radiation Oncology

## 2015-03-06 DIAGNOSIS — Z510 Encounter for antineoplastic radiation therapy: Secondary | ICD-10-CM | POA: Diagnosis not present

## 2015-03-07 ENCOUNTER — Ambulatory Visit
Admission: RE | Admit: 2015-03-07 | Discharge: 2015-03-07 | Disposition: A | Discharge status: Home / Self Care | Payer: Medicare HMO | Source: Ambulatory Visit | Attending: Radiation Oncology | Admitting: Radiation Oncology

## 2015-03-07 DIAGNOSIS — Z510 Encounter for antineoplastic radiation therapy: Secondary | ICD-10-CM | POA: Diagnosis not present

## 2015-03-10 ENCOUNTER — Encounter: Payer: Self-pay | Admitting: Radiation Oncology

## 2015-03-10 ENCOUNTER — Ambulatory Visit
Admission: RE | Admit: 2015-03-10 | Discharge: 2015-03-10 | Disposition: A | Discharge status: Home / Self Care | Payer: Medicare HMO | Source: Ambulatory Visit | Attending: Radiation Oncology | Admitting: Radiation Oncology

## 2015-03-10 VITALS — BP 134/75 | HR 72 | Temp 97.4°F | Ht 68.0 in | Wt 143.9 lb

## 2015-03-10 DIAGNOSIS — Z510 Encounter for antineoplastic radiation therapy: Secondary | ICD-10-CM | POA: Diagnosis not present

## 2015-03-10 DIAGNOSIS — C3411 Malignant neoplasm of upper lobe, right bronchus or lung: Secondary | ICD-10-CM

## 2015-03-10 NOTE — Progress Notes (Signed)
   Weekly Management Note:  Outpatient    ICD-9-CM ICD-10-CM   1. Primary malignant neoplasm of right upper lobe of lung (HCC) 162.3 C34.11     Current Dose:  40 Gy  Projected Dose: 50 Gy   Narrative:  The patient presents for routine under treatment assessment.  CBCT/MVCT images/Port film x-rays were reviewed.  The chart was checked. No complaints, breathing better since starting RT.  Physical Findings:  height is '5\' 8"'$  (1.727 m) and weight is 143 lb 14.4 oz (65.273 kg). His temperature is 97.4 F (36.3 C). His blood pressure is 134/75 and his pulse is 72. His oxygen saturation is 100%.   Wt Readings from Last 3 Encounters:  03/10/15 143 lb 14.4 oz (65.273 kg)  03/04/15 140 lb 8 oz (63.73 kg)  10/11/14 134 lb (60.782 kg)   Ambulatory, NAD, + [place] O2  Impression:  The patient is tolerating radiotherapy.  Plan:  Continue radiotherapy as planned.  F/u in 1 mo at [facility], close to pt's home  ________________________________   Eppie Gibson, M.D.

## 2015-03-10 NOTE — Progress Notes (Signed)
Mr. Daniel Hooper is here for his 8/10 fraction to his lung. He reports he is feeling well, with some very mild fatigue. He denies any pain, and his skin is intact, without redness or tenderness. He denies any shortness of breath at this time, and states his breathing may be slightly better since starting radiation. He also states he is eating and drinking well.  BP 134/75 mmHg  Pulse 72  Temp(Src) 97.4 F (36.3 C)  Ht '5\' 8"'$  (1.727 m)  Wt 143 lb 14.4 oz (65.273 kg)  BMI 21.89 kg/m2  SpO2 100%   Wt Readings from Last 3 Encounters:  03/10/15 143 lb 14.4 oz (65.273 kg)  03/04/15 140 lb 8 oz (63.73 kg)  10/11/14 134 lb (60.782 kg)

## 2015-03-11 ENCOUNTER — Ambulatory Visit
Admission: RE | Admit: 2015-03-11 | Discharge: 2015-03-11 | Disposition: A | Discharge status: Home / Self Care | Payer: Medicare HMO | Source: Ambulatory Visit | Attending: Radiation Oncology | Admitting: Radiation Oncology

## 2015-03-11 ENCOUNTER — Telehealth: Payer: Self-pay | Admitting: *Deleted

## 2015-03-11 DIAGNOSIS — Z510 Encounter for antineoplastic radiation therapy: Secondary | ICD-10-CM | POA: Diagnosis not present

## 2015-03-11 NOTE — Telephone Encounter (Signed)
CALLED PATIENT TO INFORM OF FU IN [place] WITH DR. Isidore Moos ON  04-08-15 @ 12 PM, LVM FOR A RETURN CALL

## 2015-03-12 ENCOUNTER — Encounter: Payer: Self-pay | Admitting: Radiation Oncology

## 2015-03-12 ENCOUNTER — Ambulatory Visit
Admission: RE | Admit: 2015-03-12 | Discharge: 2015-03-12 | Disposition: A | Discharge status: Home / Self Care | Payer: Medicare HMO | Source: Ambulatory Visit | Attending: Radiation Oncology | Admitting: Radiation Oncology

## 2015-03-12 DIAGNOSIS — Z510 Encounter for antineoplastic radiation therapy: Secondary | ICD-10-CM | POA: Diagnosis not present

## 2015-03-13 ENCOUNTER — Ambulatory Visit: Payer: Medicare HMO | Admitting: Radiation Oncology

## 2015-03-20 NOTE — Progress Notes (Signed)
  Radiation Oncology         [phone] ________________________________  Name: Daniel Hooper MRN: [id number]  Date: 03/12/2015  DOB: 1945-10-22  End of Treatment Note    T2aN0M0 Adenocarcinoma, Right Upper Lung - recurrent  Indication for treatment:  Local control       Radiation treatment dates:   02/26/2015-03/12/2015  Site/dose:   Right upper lung retreat / 50 Gy in 10 fractions  Beams/energy:  Stereotactic VMAT / 6FFF photons  Narrative: The patient tolerated radiation treatment relatively well.      Plan: The patient has completed radiation treatment. The patient will return to radiation oncology clinic for routine followup in one month at [facility], close to patient's home. I advised them to call or return sooner if they have any questions or concerns related to their recovery or treatment.  -----------------------------------  Eppie Gibson, MD

## 2015-07-04 ENCOUNTER — Other Ambulatory Visit: Payer: Self-pay | Admitting: Cardiovascular Disease

## 2015-10-22 ENCOUNTER — Encounter: Payer: Self-pay | Admitting: Cardiovascular Disease

## 2015-10-22 ENCOUNTER — Ambulatory Visit (INDEPENDENT_AMBULATORY_CARE_PROVIDER_SITE_OTHER): Payer: Medicare HMO | Admitting: Cardiovascular Disease

## 2015-10-22 VITALS — BP 122/72 | HR 90 | Ht 68.0 in | Wt 133.0 lb

## 2015-10-22 DIAGNOSIS — I252 Old myocardial infarction: Secondary | ICD-10-CM

## 2015-10-22 DIAGNOSIS — I471 Supraventricular tachycardia: Secondary | ICD-10-CM | POA: Diagnosis not present

## 2015-10-22 DIAGNOSIS — I10 Essential (primary) hypertension: Secondary | ICD-10-CM | POA: Diagnosis not present

## 2015-10-22 DIAGNOSIS — R000 Tachycardia, unspecified: Secondary | ICD-10-CM

## 2015-10-22 DIAGNOSIS — I2510 Atherosclerotic heart disease of native coronary artery without angina pectoris: Secondary | ICD-10-CM

## 2015-10-22 NOTE — Progress Notes (Signed)
Patient ID: Daniel Hooper, male   DOB: [birth year], 70 y.o.   MRN: [id number]      SUBJECTIVE: The patient presents for follow-up of hypertension, SVT, sinus tachycardia, and CAD. He had a non-STEMI with a very mild troponin elevation 0.08 in the setting of sinus tachycardia in the past. In March 2014, his echocardiogram reportedly revealed normal left ventricular systolic function, diastolic dysfunction, and mild inferobasal hypokinesis. A stress test on 07/31/2012 showed a small area of inferoseptal scar with no evidence of ischemia, and was deemed a low risk study. He also has COPD and right upper lobe adenocarcinoma and received radiation therapy.  He currently denies chest pain and palpitations. His chronic exertional dyspnea is no worse from baseline. He denies leg swelling. Says he has been hospitalized for COPD during winter months.  Uses 2L oxygen chronically.  ECG performed in the office today which I personally interpreted demonstrated sinus rhythm with possible old septal infarct.  Review of Systems: As per "subjective", otherwise negative.  No Known Allergies  Current Outpatient Prescriptions  Medication Sig Dispense Refill  . acetaminophen (TYLENOL 8 HOUR ARTHRITIS PAIN) 650 MG CR tablet Take 650 mg by mouth every 8 (eight) hours as needed for pain.    Marland Kitchen aspirin 81 MG chewable tablet Chew 81 mg by mouth daily.    . budesonide-formoterol (SYMBICORT) 160-4.5 MCG/ACT inhaler Inhale 2 puffs into the lungs 2 (two) times daily.    . carvedilol (COREG) 6.25 MG tablet TAKE ONE TABLET BY MOUTH TWICE DAILY 60 tablet 3  . cyproheptadine (PERIACTIN) 4 MG tablet Take 4 mg by mouth 3 (three) times daily.    Marland Kitchen emollient (RADIAGEL) gel Apply topically as needed for wound care.    Marland Kitchen ipratropium-albuterol (DUONEB) 0.5-2.5 (3) MG/3ML SOLN Take 3 mLs by nebulization every 6 (six) hours.     . montelukast (SINGULAIR) 10 MG tablet Take 10 mg by mouth at bedtime.    . nitroGLYCERIN (NITROSTAT) 0.4  MG SL tablet Place 1 tablet (0.4 mg total) under the tongue every 5 (five) minutes as needed for chest pain. (Not more than 3 in 15 minute time frame, if no relief - proceed to ED) 25 tablet 3  . OXYGEN-HELIUM IN Inhale into the lungs at bedtime as needed.    . promethazine (PHENERGAN) 25 MG tablet Take 25 mg by mouth every 6 (six) hours as needed for nausea or vomiting.    . simvastatin (ZOCOR) 20 MG tablet Take 20 mg by mouth daily at 6 PM.     . SPIRIVA HANDIHALER 18 MCG inhalation capsule Place 18 mcg into inhaler and inhale daily.     . tamsulosin (FLOMAX) 0.4 MG CAPS capsule Take 0.4 mg by mouth daily after supper.    . theophylline (THEODUR) 300 MG 12 hr tablet Take 300 mg by mouth 2 (two) times daily.    Marland Kitchen venlafaxine XR (EFFEXOR-XR) 150 MG 24 hr capsule Take 150 mg by mouth daily with breakfast.    . VENTOLIN HFA 108 (90 BASE) MCG/ACT inhaler Inhale 2 puffs into the lungs every 6 (six) hours as needed for shortness of breath.     . zolpidem (AMBIEN) 10 MG tablet Take 5 mg by mouth at bedtime as needed for sleep.     No current facility-administered medications for this visit.    Past Medical History  Diagnosis Date  . Hypoxic     History of hypoxic respiratory failure  . Sinus tachycardia ([place])   . Palpitations   .  Non-ST elevated myocardial infarction (non-STEMI) ([place])   . Hypertension   . COPD (chronic obstructive pulmonary disease) ([facility])     History of tobacco use, O2 - 2 liters per min., uses on & off   . Shortness of breath   . H/O echocardiogram 2013  . Lung cancer ([place]) 2015  . S/P radiation therapy 10/01/2013, 10/03/2013, 10/05/2013, 10/08/2013, 10/10/2013    Right upper lung / 50 Gy in 5 fractions    Past Surgical History  Procedure Laterality Date  . None    . Multiple extractions with alveoloplasty N/A 03/20/2013    Procedure: MULTIPLE EXTRACION 8, 9, 11, 13, 14, 15, 16, 18, 19, 20, 21, 23, 24, 25, 26, 27, 28, 29, 30 WITH ALVEOLOPLASTY X 4, BILATERAL TORI;  Surgeon:  Gae Bon, DDS;  Location: [facility];  Service: Oral Surgery;  Laterality: N/A;    Social History   Social History  . Marital Status: Single    Spouse Name: N/A  . Number of Children: N/A  . Years of Education: N/A   Occupational History  . Not on file.   Social History Main Topics  . Smoking status: Former Smoker -- 1.00 packs/day for 30 years    Types: Cigarettes    Quit date: 05/24/2006  . Smokeless tobacco: Never Used  . Alcohol Use: No     Comment: quit- 2012- since need O2 for breathing   . Drug Use: No  . Sexual Activity: Not on file   Other Topics Concern  . Not on file   Social History Narrative     Filed Vitals:   10/22/15 1310  BP: 122/72  Pulse: 90  Height: '5\' 8"'$  (1.727 m)  Weight: 133 lb (60.328 kg)  SpO2: 99%    PHYSICAL EXAM General: NAD, using oxygen by nasal cannula  Neck: No JVD, no thyromegaly.  Lungs: Clear but diminished to auscultation bilaterally with reduced respiratory effort.  CV: Nondisplaced PMI. Regular rate and rhythm, normal S1/S2, no S3/S4, no murmur. No pretibial or periankle edema.  Abdomen: Soft, nontender, no distention.  Neurologic: Alert and oriented x 3.  Psych: Normal affect. Skin: Normal. Musculoskeletal: No gross deformities.   ECG: Most recent ECG reviewed.      ASSESSMENT AND PLAN: 1. SVT: No further recurrences. Continue Coreg.  2. Sinus tachycardia: Controlled with Coreg, HR 90 bpm today.  3. CAD and h/o NSTEMI vs demand ischemia: Symptomatically stable on current therapy. Normal left ventricular systolic function as per echocardiogram in March 2014, and also had a low risk nuclear stress test at that time. His troponin was only mildly elevated at .08 in the setting of sinus tachycardia. No noninvasive testing is warranted at this time.   4. Essential hypertension: Well controlled on carvedilol 6.25 mg bid. No changes.  Dispo: fu 1 year  Kate Sable, M.D., F.A.C.C.

## 2015-10-22 NOTE — Patient Instructions (Signed)
Continue all current medications. Your physician wants you to follow up in:  1 year.  You will receive a reminder letter in the mail one-two months in advance.  If you don't receive a letter, please call our office to schedule the follow up appointment   

## 2015-11-10 ENCOUNTER — Other Ambulatory Visit: Payer: Self-pay | Admitting: Cardiovascular Disease

## 2016-10-22 ENCOUNTER — Ambulatory Visit (INDEPENDENT_AMBULATORY_CARE_PROVIDER_SITE_OTHER): Payer: Medicare HMO | Admitting: Cardiovascular Disease

## 2016-10-22 ENCOUNTER — Encounter: Payer: Self-pay | Admitting: Cardiovascular Disease

## 2016-10-22 VITALS — BP 107/66 | HR 80 | Ht 68.0 in | Wt 137.0 lb

## 2016-10-22 DIAGNOSIS — I10 Essential (primary) hypertension: Secondary | ICD-10-CM

## 2016-10-22 DIAGNOSIS — I471 Supraventricular tachycardia: Secondary | ICD-10-CM

## 2016-10-22 DIAGNOSIS — I2510 Atherosclerotic heart disease of native coronary artery without angina pectoris: Secondary | ICD-10-CM

## 2016-10-22 DIAGNOSIS — R000 Tachycardia, unspecified: Secondary | ICD-10-CM

## 2016-10-22 DIAGNOSIS — I252 Old myocardial infarction: Secondary | ICD-10-CM

## 2016-10-22 NOTE — Patient Instructions (Signed)

## 2016-10-22 NOTE — Progress Notes (Signed)
SUBJECTIVE: The patient presents for follow-up of hypertension, SVT, sinus tachycardia, and CAD. He had a non-STEMI with a very mild troponin elevation 0.08 in the setting of sinus tachycardia in the past. In March 2014, his echocardiogram reportedly revealed normal left ventricular systolic function, diastolic dysfunction, and mild inferobasal hypokinesis. A stress test on 07/31/2012 showed a small area of inferoseptal scar with no evidence of ischemia, and was deemed a low risk study. He also has COPD and right upper lobe adenocarcinoma and received radiation therapy.  Uses 2L oxygen chronically.  ECG performed in the office today which I ordered and personally interpreted demonstrates normal sinus rhythm with no ischemic ST segment or T-wave abnormalities, nor any arrhythmias.  He denies chest pain. He has stable exertional dyspnea. He denies palpitations. He has had no hospitalizations in the past year.  He keeps himself busy mowing his lawn.    Review of Systems: As per "subjective", otherwise negative.  No Known Allergies  Current Outpatient Prescriptions  Medication Sig Dispense Refill  . acetaminophen (TYLENOL 8 HOUR ARTHRITIS PAIN) 650 MG CR tablet Take 650 mg by mouth every 8 (eight) hours as needed for pain.    Marland Kitchen aspirin 81 MG chewable tablet Chew 81 mg by mouth daily.    . budesonide-formoterol (SYMBICORT) 160-4.5 MCG/ACT inhaler Inhale 2 puffs into the lungs 2 (two) times daily.    . carvedilol (COREG) 6.25 MG tablet TAKE ONE TABLET BY MOUTH TWICE DAILY 60 tablet 6  . cyproheptadine (PERIACTIN) 4 MG tablet Take 4 mg by mouth 3 (three) times daily.    Marland Kitchen emollient (RADIAGEL) gel Apply topically as needed for wound care.    Marland Kitchen ipratropium-albuterol (DUONEB) 0.5-2.5 (3) MG/3ML SOLN Take 3 mLs by nebulization every 6 (six) hours.     . nitroGLYCERIN (NITROSTAT) 0.4 MG SL tablet Place 1 tablet (0.4 mg total) under the tongue every 5 (five) minutes as needed for chest pain.  (Not more than 3 in 15 minute time frame, if no relief - proceed to ED) 25 tablet 3  . omeprazole (PRILOSEC) 20 MG capsule Take 20 mg by mouth daily.    Donell Sievert IN Inhale into the lungs at bedtime as needed.    . promethazine (PHENERGAN) 25 MG tablet Take 25 mg by mouth every 6 (six) hours as needed for nausea or vomiting.    . simvastatin (ZOCOR) 20 MG tablet Take 20 mg by mouth daily at 6 PM.     . SPIRIVA HANDIHALER 18 MCG inhalation capsule Place 18 mcg into inhaler and inhale daily.     . tamsulosin (FLOMAX) 0.4 MG CAPS capsule Take 0.4 mg by mouth daily after supper.    . theophylline (THEODUR) 300 MG 12 hr tablet Take 300 mg by mouth 2 (two) times daily.    Marland Kitchen venlafaxine XR (EFFEXOR-XR) 150 MG 24 hr capsule Take 150 mg by mouth daily with breakfast.    . VENTOLIN HFA 108 (90 BASE) MCG/ACT inhaler Inhale 2 puffs into the lungs every 6 (six) hours as needed for shortness of breath.     . zolpidem (AMBIEN) 10 MG tablet Take 5 mg by mouth at bedtime as needed for sleep.     No current facility-administered medications for this visit.     Past Medical History:  Diagnosis Date  . COPD (chronic obstructive pulmonary disease) ([facility])    History of tobacco use, O2 - 2 liters per min., uses on & off   . H/O  echocardiogram 2013  . Hypertension   . Hypoxic    History of hypoxic respiratory failure  . Lung cancer ([place]) 2015  . Non-ST elevated myocardial infarction (non-STEMI) ([place])   . Palpitations   . S/P radiation therapy 10/01/2013, 10/03/2013, 10/05/2013, 10/08/2013, 10/10/2013   Right upper lung / 50 Gy in 5 fractions  . Shortness of breath   . Sinus tachycardia     Past Surgical History:  Procedure Laterality Date  . MULTIPLE EXTRACTIONS WITH ALVEOLOPLASTY N/A 03/20/2013   Procedure: MULTIPLE EXTRACION 8, 9, 11, 13, 14, 15, 16, 18, 19, 20, 21, 23, 24, 25, 26, 27, 28, 29, 30 WITH ALVEOLOPLASTY X 4, BILATERAL TORI;  Surgeon: Gae Bon, DDS;  Location: [facility];  Service: Oral  Surgery;  Laterality: N/A;  . none      Social History   Social History  . Marital status: Single    Spouse name: N/A  . Number of children: N/A  . Years of education: N/A   Occupational History  . Not on file.   Social History Main Topics  . Smoking status: Former Smoker    Packs/day: 1.00    Years: 30.00    Types: Cigarettes    Quit date: 05/24/2006  . Smokeless tobacco: Never Used  . Alcohol use No     Comment: quit- 2012- since need O2 for breathing   . Drug use: No  . Sexual activity: Not on file   Other Topics Concern  . Not on file   Social History Narrative  . No narrative on file     Vitals:   10/22/16 0821  BP: 107/66  Pulse: 80  SpO2: 100%  Weight: 137 lb (62.1 kg)  Height: 5\' 8"  (1.727 m)    Wt Readings from Last 3 Encounters:  10/22/16 137 lb (62.1 kg)  10/22/15 133 lb (60.3 kg)  03/10/15 143 lb 14.4 oz (65.3 kg)     PHYSICAL EXAM General: NAD HEENT: Normal. Neck: No JVD, no thyromegaly. Lungs: No rales or wheezes. CV: Nondisplaced PMI.  Regular rate and rhythm, normal S1/S2, no S3/S4, no murmur. No pretibial or periankle edema.  No carotid bruit.   Abdomen: Soft, nontender, no distention.  Neurologic: Alert and oriented.  Psych: Normal affect. Skin: Normal. Musculoskeletal: No gross deformities.    ECG: Most recent ECG reviewed.   Labs: Lab Results  Component Value Date/Time   K 4.0 03/15/2013 12:58 PM   BUN 7.9 01/18/2014 11:22 AM   CREATININE 0.7 01/18/2014 11:22 AM   HGB 15.5 03/15/2013 12:58 PM     Lipids: No results found for: LDLCALC, LDLDIRECT, CHOL, TRIG, HDL     ASSESSMENT AND PLAN:  1. SVT: No recurrences. Continue Coreg.  2. Sinus tachycardia: Controlled with Coreg. Heart rate 80 bpm.  3. CAD and h/o NSTEMI vs demand ischemia: Symptomatically stable on Coreg, aspirin, and simvastatin. Normal left ventricular systolic function as per echocardiogram in March 2014, and also had a low risk nuclear stress test  at that time. His troponin was only mildly elevated at .08 in the setting of sinus tachycardia. No noninvasive testing is warranted at this time.   4. Essential hypertension: Controlled. No changes to therapy.   Disposition: Follow up 1 yr  Kate Sable, M.D., F.A.C.C.

## 2016-11-09 ENCOUNTER — Other Ambulatory Visit (HOSPITAL_COMMUNITY): Payer: Self-pay | Admitting: Radiation Oncology

## 2016-11-09 DIAGNOSIS — C3411 Malignant neoplasm of upper lobe, right bronchus or lung: Secondary | ICD-10-CM

## 2016-11-19 ENCOUNTER — Ambulatory Visit (HOSPITAL_COMMUNITY)
Admission: RE | Admit: 2016-11-19 | Discharge: 2016-11-19 | Disposition: A | Discharge status: Home / Self Care | Payer: Medicare HMO | Source: Ambulatory Visit | Attending: Radiation Oncology | Admitting: Radiation Oncology

## 2016-11-19 DIAGNOSIS — C3411 Malignant neoplasm of upper lobe, right bronchus or lung: Secondary | ICD-10-CM | POA: Insufficient documentation

## 2016-11-19 DIAGNOSIS — Z923 Personal history of irradiation: Secondary | ICD-10-CM | POA: Insufficient documentation

## 2016-11-19 DIAGNOSIS — I700 Atherosclerosis of aorta: Secondary | ICD-10-CM | POA: Insufficient documentation

## 2016-11-19 DIAGNOSIS — N400 Enlarged prostate without lower urinary tract symptoms: Secondary | ICD-10-CM | POA: Diagnosis not present

## 2016-11-19 DIAGNOSIS — M8448XA Pathological fracture, other site, initial encounter for fracture: Secondary | ICD-10-CM | POA: Insufficient documentation

## 2016-11-19 DIAGNOSIS — J439 Emphysema, unspecified: Secondary | ICD-10-CM | POA: Diagnosis not present

## 2016-11-19 DIAGNOSIS — I2510 Atherosclerotic heart disease of native coronary artery without angina pectoris: Secondary | ICD-10-CM | POA: Insufficient documentation

## 2016-11-19 LAB — GLUCOSE, CAPILLARY: GLUCOSE-CAPILLARY: 87 mg/dL (ref 65–99)

## 2016-11-19 MED ORDER — FLUDEOXYGLUCOSE F - 18 (FDG) INJECTION
11.1800 | Freq: Once | INTRAVENOUS | Status: AC | PRN
  Administered 2016-11-19: 11.18 via INTRAVENOUS

## 2017-02-10 ENCOUNTER — Other Ambulatory Visit: Payer: Self-pay | Admitting: Cardiovascular Disease

## 2017-02-10 MED ORDER — CARVEDILOL 6.25 MG PO TABS: 6.2500 mg | ORAL_TABLET | Freq: Two times a day (BID) | ORAL | 6 refills | Status: DC

## 2017-02-10 NOTE — Telephone Encounter (Signed)
°  1. Which medications need to be refilled? (please list name of each medication and dose if known)  carvedilol (COREG) 6.25 MG tablet    2. Which pharmacy/location (including street and city if local pharmacy) is medication to be sent to? [facility] [place]   3. Do they need a 30 day or 90 day supply?  Was filled by Dr. Wenda Overland. Patient is requesting a refill until He can find another PCP.     Please call [phone].

## 2017-02-10 NOTE — Telephone Encounter (Signed)
Prescription sent

## 2017-02-28 DIAGNOSIS — J449 Chronic obstructive pulmonary disease, unspecified: Secondary | ICD-10-CM | POA: Diagnosis not present

## 2017-03-04 DIAGNOSIS — I2510 Atherosclerotic heart disease of native coronary artery without angina pectoris: Secondary | ICD-10-CM | POA: Diagnosis not present

## 2017-03-04 DIAGNOSIS — I700 Atherosclerosis of aorta: Secondary | ICD-10-CM | POA: Diagnosis not present

## 2017-03-04 DIAGNOSIS — C3411 Malignant neoplasm of upper lobe, right bronchus or lung: Secondary | ICD-10-CM | POA: Diagnosis not present

## 2017-03-08 DIAGNOSIS — I700 Atherosclerosis of aorta: Secondary | ICD-10-CM | POA: Diagnosis not present

## 2017-03-08 DIAGNOSIS — C3411 Malignant neoplasm of upper lobe, right bronchus or lung: Secondary | ICD-10-CM | POA: Diagnosis not present

## 2017-03-08 DIAGNOSIS — J439 Emphysema, unspecified: Secondary | ICD-10-CM | POA: Diagnosis not present

## 2017-03-14 DIAGNOSIS — Z6820 Body mass index (BMI) 20.0-20.9, adult: Secondary | ICD-10-CM | POA: Diagnosis not present

## 2017-03-14 DIAGNOSIS — Z125 Encounter for screening for malignant neoplasm of prostate: Secondary | ICD-10-CM | POA: Diagnosis not present

## 2017-03-14 DIAGNOSIS — I10 Essential (primary) hypertension: Secondary | ICD-10-CM | POA: Diagnosis not present

## 2017-03-14 DIAGNOSIS — E7849 Other hyperlipidemia: Secondary | ICD-10-CM | POA: Diagnosis not present

## 2017-03-14 DIAGNOSIS — J441 Chronic obstructive pulmonary disease with (acute) exacerbation: Secondary | ICD-10-CM | POA: Diagnosis not present

## 2017-03-14 DIAGNOSIS — Z79899 Other long term (current) drug therapy: Secondary | ICD-10-CM | POA: Diagnosis not present

## 2017-03-18 ENCOUNTER — Encounter (INDEPENDENT_AMBULATORY_CARE_PROVIDER_SITE_OTHER): Payer: Self-pay | Admitting: *Deleted

## 2017-03-31 DIAGNOSIS — J449 Chronic obstructive pulmonary disease, unspecified: Secondary | ICD-10-CM | POA: Diagnosis not present

## 2017-04-30 DIAGNOSIS — J449 Chronic obstructive pulmonary disease, unspecified: Secondary | ICD-10-CM | POA: Diagnosis not present

## 2017-05-31 DIAGNOSIS — J449 Chronic obstructive pulmonary disease, unspecified: Secondary | ICD-10-CM | POA: Diagnosis not present

## 2017-06-16 DIAGNOSIS — Z0000 Encounter for general adult medical examination without abnormal findings: Secondary | ICD-10-CM | POA: Diagnosis not present

## 2017-06-16 DIAGNOSIS — Z6821 Body mass index (BMI) 21.0-21.9, adult: Secondary | ICD-10-CM | POA: Diagnosis not present

## 2017-06-16 DIAGNOSIS — I10 Essential (primary) hypertension: Secondary | ICD-10-CM | POA: Diagnosis not present

## 2017-06-16 DIAGNOSIS — E7849 Other hyperlipidemia: Secondary | ICD-10-CM | POA: Diagnosis not present

## 2017-06-16 DIAGNOSIS — J441 Chronic obstructive pulmonary disease with (acute) exacerbation: Secondary | ICD-10-CM | POA: Diagnosis not present

## 2017-06-16 DIAGNOSIS — Z6820 Body mass index (BMI) 20.0-20.9, adult: Secondary | ICD-10-CM | POA: Diagnosis not present

## 2017-06-24 DIAGNOSIS — J449 Chronic obstructive pulmonary disease, unspecified: Secondary | ICD-10-CM | POA: Diagnosis not present

## 2017-07-01 DIAGNOSIS — J449 Chronic obstructive pulmonary disease, unspecified: Secondary | ICD-10-CM | POA: Diagnosis not present

## 2017-07-11 DIAGNOSIS — Z136 Encounter for screening for cardiovascular disorders: Secondary | ICD-10-CM | POA: Diagnosis not present

## 2017-07-11 DIAGNOSIS — Z87891 Personal history of nicotine dependence: Secondary | ICD-10-CM | POA: Diagnosis not present

## 2017-07-28 IMAGING — CT NM PET TUM IMG RESTAG (PS) SKULL BASE T - THIGH
1 of 7 series · 1 of 25 positions shown · non-contrast
Comparison: Multiple priors, most recently chest CT 12/15/2014.
PET-CT 09/05/2013.

CLINICAL DATA: Subsequent treatment strategy for left-sided lung
cancer. Restaging examination. Radiation therapy completed
10/10/2013.

EXAM:
NUCLEAR MEDICINE PET SKULL BASE TO THIGH
TECHNIQUE: 6.6 mCi F-18 FDG was injected intravenously. Full-ring PET imaging
was performed from the skull base to thigh after the radiotracer. CT
data was obtained and used for attenuation correction and anatomic
localization.
FASTING BLOOD GLUCOSE:  Value: 90 for mg/dl

[Series 4: ct sk_thigh 5.0 b31f · axial · 5.0mm · 0.83mm/px · 1 of 215 slices shown]
[im 215/215  brain]
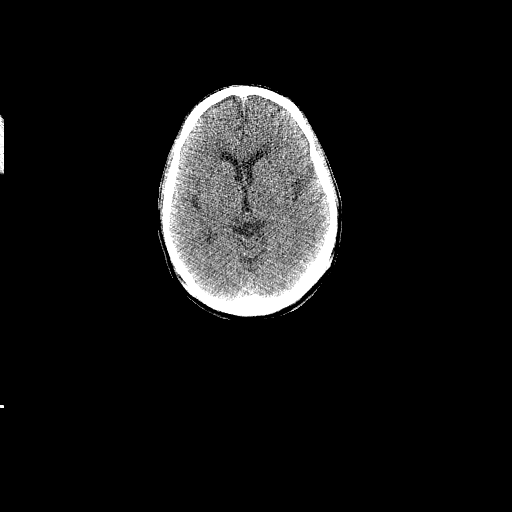

[1 of 25 positions shown; findings below may reference images not displayed]

FINDINGS: NECK

No hypermetabolic lymph nodes in the neck.

CHEST

Again noted is a mass-like area of architectural distortion in the
periphery of the right upper lobe, which is at the site of the
previously treated right upper lobe adenocarcinoma. The bulkiest
portion of this region measures 2.4 x 3.2 cm on today's examination
(image 24 of series 6), which appears similar to the most recent
prior study, but has clearly enlarged compared to 11/01/2014. More
inferior to this lesion (image 27 of series 6) there is a bulkier
appearing area of septal thickening, nodularity and architectural
distortion, which overall measures 2.9 x 1.5 cm. This entire region
in the right upper lobe is hypermetabolic (SUVmax = 3.3, suggesting
recurrent disease. Multiple tiny satellite micronodules are noted
throughout the periphery of the right upper lobe, which could be
tumor, or could be post obstructive changes. A few other scattered
ground-glass attenuation nodules are also noted in the right lung,
largest of which is an 8 mm ground-glass attenuation nodule anterior
right middle lobe (image 36 of series 6), which appears new compared
to prior study 04/16/2014, but is similar to the more recent prior
examinations. No confluent consolidative airspace disease. No
pleural effusions. Mild diffuse bronchial wall thickening with
moderate centrilobular emphysema. Nonenlarged low right paratracheal
lymph node measuring 7 mm in short axis demonstrates hypermetabolism
(SUVmax = 3.1). No other definite hypermetabolic hilar or
mediastinal lymph nodes are noted. Heart size is normal. There is no
significant pericardial fluid, thickening or pericardial
calcification. There is atherosclerosis of the thoracic aorta, the
great vessels of the mediastinum and the coronary arteries,
including calcified atherosclerotic plaque in the left main, left
anterior descending, left circumflex and right coronary arteries. )

ABDOMEN/PELVIS

No abnormal hypermetabolic activity within the liver, pancreas,
adrenal glands, or spleen. No hypermetabolic lymph nodes in the
abdomen or pelvis. No significant volume of ascites. No
pneumoperitoneum. No pathologic distention of small bowel or colon.
Numerous colonic diverticulae are noted, without surrounding
inflammatory changes to suggest an acute diverticulitis. Extensive
atherosclerosis throughout the abdominal and pelvic vasculature,
without evidence of aneurysm.

SKELETON

Healing nondisplaced fracture of the lateral aspect of the right
fourth rib immediately adjacent to the treated right upper lobe
lesion, demonstrates mild hypermetabolism (SUVmax = 4.8), likely a
pathologic fracture at a site of radiation osteonecrosis. No other
focal hypermetabolic activity to suggest skeletal metastasis.
IMPRESSION: 1. Findings are concerning for local recurrence of disease with
enlarging masslike and nodular opacities in the right upper lobe
which demonstrate hypermetabolism, as well as a low right
paratracheal lymph node which is nonenlarged, but hypermetabolic. In
addition, there are several new ground-glass attenuation nodules in
the anterior right middle lobe (similar to recent prior
examinations, but new compared to more remote prior studies),
concerning for multicentric disease.
2. Nondisplaced pathologic fracture through the lateral aspect of
the right fourth rib immediately adjacent to the treated right upper
lobe lesion, potentially in a site of radiation osteonecrosis. No
associated pneumothorax.
3. Mild diffuse bronchial wall thickening with moderate
centrilobular emphysema; imaging findings suggestive of underlying
COPD.
4. Atherosclerosis, including left main and 3 vessel coronary artery
disease.
5. Colonic diverticulosis without evidence of acute diverticulitis
at this time.
These results will be called to the ordering clinician or
representative by the Radiologist Assistant, and communication
documented in the PACS or zVision Dashboard.

## 2017-07-29 DIAGNOSIS — J449 Chronic obstructive pulmonary disease, unspecified: Secondary | ICD-10-CM | POA: Diagnosis not present

## 2017-08-08 DIAGNOSIS — M810 Age-related osteoporosis without current pathological fracture: Secondary | ICD-10-CM | POA: Diagnosis not present

## 2017-08-08 DIAGNOSIS — M8589 Other specified disorders of bone density and structure, multiple sites: Secondary | ICD-10-CM | POA: Diagnosis not present

## 2017-08-29 DIAGNOSIS — J449 Chronic obstructive pulmonary disease, unspecified: Secondary | ICD-10-CM | POA: Diagnosis not present

## 2017-09-12 ENCOUNTER — Other Ambulatory Visit: Payer: Self-pay | Admitting: Cardiovascular Disease

## 2017-09-12 MED ORDER — CARVEDILOL 6.25 MG PO TABS: 6.2500 mg | ORAL_TABLET | Freq: Two times a day (BID) | ORAL | 1 refills | Status: DC

## 2017-09-12 NOTE — Telephone Encounter (Signed)
°*  STAT* If patient is at the pharmacy, call can be transferred to refill team.   1. Which medications need to be refilled? carvedilol (COREG) 6.25 MG tablet    2. Which pharmacy/location (including street and city if local pharmacy) is medication to be sent to? [place]  3. Do they need a 30 day or 90 day supply? [place]

## 2017-09-12 NOTE — Telephone Encounter (Signed)
Medication sent to pharmacy  

## 2017-09-19 DIAGNOSIS — R0602 Shortness of breath: Secondary | ICD-10-CM | POA: Diagnosis not present

## 2017-09-19 DIAGNOSIS — Z923 Personal history of irradiation: Secondary | ICD-10-CM | POA: Diagnosis not present

## 2017-09-19 DIAGNOSIS — J440 Chronic obstructive pulmonary disease with acute lower respiratory infection: Secondary | ICD-10-CM | POA: Diagnosis not present

## 2017-09-19 DIAGNOSIS — J449 Chronic obstructive pulmonary disease, unspecified: Secondary | ICD-10-CM | POA: Diagnosis not present

## 2017-09-19 DIAGNOSIS — R0682 Tachypnea, not elsewhere classified: Secondary | ICD-10-CM | POA: Diagnosis not present

## 2017-09-19 DIAGNOSIS — J209 Acute bronchitis, unspecified: Secondary | ICD-10-CM | POA: Diagnosis not present

## 2017-09-19 DIAGNOSIS — Z7982 Long term (current) use of aspirin: Secondary | ICD-10-CM | POA: Diagnosis not present

## 2017-09-19 DIAGNOSIS — E7800 Pure hypercholesterolemia, unspecified: Secondary | ICD-10-CM | POA: Diagnosis not present

## 2017-09-19 DIAGNOSIS — Z79899 Other long term (current) drug therapy: Secondary | ICD-10-CM | POA: Diagnosis not present

## 2017-09-19 DIAGNOSIS — Z85118 Personal history of other malignant neoplasm of bronchus and lung: Secondary | ICD-10-CM | POA: Diagnosis not present

## 2017-09-20 DIAGNOSIS — E7849 Other hyperlipidemia: Secondary | ICD-10-CM | POA: Diagnosis not present

## 2017-09-20 DIAGNOSIS — R279 Unspecified lack of coordination: Secondary | ICD-10-CM | POA: Diagnosis not present

## 2017-09-20 DIAGNOSIS — I10 Essential (primary) hypertension: Secondary | ICD-10-CM | POA: Diagnosis not present

## 2017-09-20 DIAGNOSIS — Z681 Body mass index (BMI) 19 or less, adult: Secondary | ICD-10-CM | POA: Diagnosis not present

## 2017-09-20 DIAGNOSIS — F329 Major depressive disorder, single episode, unspecified: Secondary | ICD-10-CM | POA: Diagnosis not present

## 2017-09-20 DIAGNOSIS — Z743 Need for continuous supervision: Secondary | ICD-10-CM | POA: Diagnosis not present

## 2017-09-20 DIAGNOSIS — K210 Gastro-esophageal reflux disease with esophagitis: Secondary | ICD-10-CM | POA: Diagnosis not present

## 2017-09-20 DIAGNOSIS — J441 Chronic obstructive pulmonary disease with (acute) exacerbation: Secondary | ICD-10-CM | POA: Diagnosis not present

## 2017-09-23 DIAGNOSIS — Z87891 Personal history of nicotine dependence: Secondary | ICD-10-CM | POA: Diagnosis not present

## 2017-09-23 DIAGNOSIS — E43 Unspecified severe protein-calorie malnutrition: Secondary | ICD-10-CM | POA: Diagnosis not present

## 2017-09-23 DIAGNOSIS — F411 Generalized anxiety disorder: Secondary | ICD-10-CM | POA: Diagnosis not present

## 2017-09-23 DIAGNOSIS — I480 Paroxysmal atrial fibrillation: Secondary | ICD-10-CM | POA: Diagnosis not present

## 2017-09-23 DIAGNOSIS — N400 Enlarged prostate without lower urinary tract symptoms: Secondary | ICD-10-CM | POA: Diagnosis not present

## 2017-09-23 DIAGNOSIS — K449 Diaphragmatic hernia without obstruction or gangrene: Secondary | ICD-10-CM | POA: Diagnosis not present

## 2017-09-23 DIAGNOSIS — J441 Chronic obstructive pulmonary disease with (acute) exacerbation: Secondary | ICD-10-CM | POA: Diagnosis not present

## 2017-09-23 DIAGNOSIS — E872 Acidosis: Secondary | ICD-10-CM | POA: Diagnosis not present

## 2017-09-23 DIAGNOSIS — I10 Essential (primary) hypertension: Secondary | ICD-10-CM | POA: Diagnosis not present

## 2017-09-23 DIAGNOSIS — J9801 Acute bronchospasm: Secondary | ICD-10-CM | POA: Diagnosis not present

## 2017-09-23 DIAGNOSIS — F419 Anxiety disorder, unspecified: Secondary | ICD-10-CM | POA: Diagnosis not present

## 2017-09-23 DIAGNOSIS — R079 Chest pain, unspecified: Secondary | ICD-10-CM | POA: Diagnosis not present

## 2017-09-23 DIAGNOSIS — E440 Moderate protein-calorie malnutrition: Secondary | ICD-10-CM | POA: Diagnosis not present

## 2017-09-23 DIAGNOSIS — Z681 Body mass index (BMI) 19 or less, adult: Secondary | ICD-10-CM | POA: Diagnosis not present

## 2017-09-23 DIAGNOSIS — R918 Other nonspecific abnormal finding of lung field: Secondary | ICD-10-CM | POA: Diagnosis not present

## 2017-09-23 DIAGNOSIS — R0602 Shortness of breath: Secondary | ICD-10-CM | POA: Diagnosis not present

## 2017-09-23 DIAGNOSIS — E876 Hypokalemia: Secondary | ICD-10-CM | POA: Diagnosis not present

## 2017-09-24 DIAGNOSIS — I480 Paroxysmal atrial fibrillation: Secondary | ICD-10-CM | POA: Diagnosis not present

## 2017-09-24 DIAGNOSIS — R918 Other nonspecific abnormal finding of lung field: Secondary | ICD-10-CM | POA: Diagnosis not present

## 2017-09-24 DIAGNOSIS — N400 Enlarged prostate without lower urinary tract symptoms: Secondary | ICD-10-CM | POA: Diagnosis not present

## 2017-09-24 DIAGNOSIS — I10 Essential (primary) hypertension: Secondary | ICD-10-CM | POA: Diagnosis not present

## 2017-09-24 DIAGNOSIS — K449 Diaphragmatic hernia without obstruction or gangrene: Secondary | ICD-10-CM | POA: Diagnosis not present

## 2017-09-24 DIAGNOSIS — J441 Chronic obstructive pulmonary disease with (acute) exacerbation: Secondary | ICD-10-CM | POA: Diagnosis not present

## 2017-09-24 DIAGNOSIS — E43 Unspecified severe protein-calorie malnutrition: Secondary | ICD-10-CM | POA: Diagnosis not present

## 2017-09-24 DIAGNOSIS — F411 Generalized anxiety disorder: Secondary | ICD-10-CM | POA: Diagnosis not present

## 2017-09-24 DIAGNOSIS — Z681 Body mass index (BMI) 19 or less, adult: Secondary | ICD-10-CM | POA: Diagnosis not present

## 2017-09-25 DIAGNOSIS — F411 Generalized anxiety disorder: Secondary | ICD-10-CM | POA: Diagnosis not present

## 2017-09-25 DIAGNOSIS — R918 Other nonspecific abnormal finding of lung field: Secondary | ICD-10-CM | POA: Diagnosis not present

## 2017-09-25 DIAGNOSIS — K449 Diaphragmatic hernia without obstruction or gangrene: Secondary | ICD-10-CM | POA: Diagnosis not present

## 2017-09-25 DIAGNOSIS — J441 Chronic obstructive pulmonary disease with (acute) exacerbation: Secondary | ICD-10-CM | POA: Diagnosis not present

## 2017-09-25 DIAGNOSIS — I10 Essential (primary) hypertension: Secondary | ICD-10-CM | POA: Diagnosis not present

## 2017-09-25 DIAGNOSIS — I480 Paroxysmal atrial fibrillation: Secondary | ICD-10-CM | POA: Diagnosis not present

## 2017-09-25 DIAGNOSIS — E43 Unspecified severe protein-calorie malnutrition: Secondary | ICD-10-CM | POA: Diagnosis not present

## 2017-09-25 DIAGNOSIS — Z681 Body mass index (BMI) 19 or less, adult: Secondary | ICD-10-CM | POA: Diagnosis not present

## 2017-09-25 DIAGNOSIS — N400 Enlarged prostate without lower urinary tract symptoms: Secondary | ICD-10-CM | POA: Diagnosis not present

## 2017-09-26 DIAGNOSIS — I10 Essential (primary) hypertension: Secondary | ICD-10-CM | POA: Diagnosis not present

## 2017-09-26 DIAGNOSIS — I480 Paroxysmal atrial fibrillation: Secondary | ICD-10-CM | POA: Diagnosis not present

## 2017-09-26 DIAGNOSIS — N400 Enlarged prostate without lower urinary tract symptoms: Secondary | ICD-10-CM | POA: Diagnosis not present

## 2017-09-26 DIAGNOSIS — F411 Generalized anxiety disorder: Secondary | ICD-10-CM | POA: Diagnosis not present

## 2017-09-26 DIAGNOSIS — R918 Other nonspecific abnormal finding of lung field: Secondary | ICD-10-CM | POA: Diagnosis not present

## 2017-09-26 DIAGNOSIS — E43 Unspecified severe protein-calorie malnutrition: Secondary | ICD-10-CM | POA: Diagnosis not present

## 2017-09-26 DIAGNOSIS — J441 Chronic obstructive pulmonary disease with (acute) exacerbation: Secondary | ICD-10-CM | POA: Diagnosis not present

## 2017-09-26 DIAGNOSIS — K449 Diaphragmatic hernia without obstruction or gangrene: Secondary | ICD-10-CM | POA: Diagnosis not present

## 2017-09-26 DIAGNOSIS — Z681 Body mass index (BMI) 19 or less, adult: Secondary | ICD-10-CM | POA: Diagnosis not present

## 2017-09-27 DIAGNOSIS — N400 Enlarged prostate without lower urinary tract symptoms: Secondary | ICD-10-CM | POA: Diagnosis not present

## 2017-09-27 DIAGNOSIS — I10 Essential (primary) hypertension: Secondary | ICD-10-CM | POA: Diagnosis not present

## 2017-09-27 DIAGNOSIS — E43 Unspecified severe protein-calorie malnutrition: Secondary | ICD-10-CM | POA: Diagnosis not present

## 2017-09-27 DIAGNOSIS — J441 Chronic obstructive pulmonary disease with (acute) exacerbation: Secondary | ICD-10-CM | POA: Diagnosis not present

## 2017-09-27 DIAGNOSIS — K449 Diaphragmatic hernia without obstruction or gangrene: Secondary | ICD-10-CM | POA: Diagnosis not present

## 2017-09-27 DIAGNOSIS — F411 Generalized anxiety disorder: Secondary | ICD-10-CM | POA: Diagnosis not present

## 2017-09-27 DIAGNOSIS — I480 Paroxysmal atrial fibrillation: Secondary | ICD-10-CM | POA: Diagnosis not present

## 2017-09-27 DIAGNOSIS — Z681 Body mass index (BMI) 19 or less, adult: Secondary | ICD-10-CM | POA: Diagnosis not present

## 2017-09-27 DIAGNOSIS — R918 Other nonspecific abnormal finding of lung field: Secondary | ICD-10-CM | POA: Diagnosis not present

## 2017-09-28 DIAGNOSIS — J449 Chronic obstructive pulmonary disease, unspecified: Secondary | ICD-10-CM | POA: Diagnosis not present

## 2017-10-21 DIAGNOSIS — Z681 Body mass index (BMI) 19 or less, adult: Secondary | ICD-10-CM | POA: Diagnosis not present

## 2017-10-21 DIAGNOSIS — J441 Chronic obstructive pulmonary disease with (acute) exacerbation: Secondary | ICD-10-CM | POA: Diagnosis not present

## 2017-10-29 DIAGNOSIS — J449 Chronic obstructive pulmonary disease, unspecified: Secondary | ICD-10-CM | POA: Diagnosis not present

## 2017-11-28 DIAGNOSIS — J449 Chronic obstructive pulmonary disease, unspecified: Secondary | ICD-10-CM | POA: Diagnosis not present

## 2017-12-09 ENCOUNTER — Encounter: Payer: Self-pay | Admitting: Cardiovascular Disease

## 2017-12-09 ENCOUNTER — Ambulatory Visit (INDEPENDENT_AMBULATORY_CARE_PROVIDER_SITE_OTHER): Payer: Medicare HMO | Admitting: Cardiovascular Disease

## 2017-12-09 ENCOUNTER — Ambulatory Visit: Payer: Medicare HMO | Admitting: Cardiovascular Disease

## 2017-12-09 VITALS — BP 124/58 | HR 74 | Ht 68.0 in | Wt 133.0 lb

## 2017-12-09 DIAGNOSIS — I25118 Atherosclerotic heart disease of native coronary artery with other forms of angina pectoris: Secondary | ICD-10-CM

## 2017-12-09 DIAGNOSIS — I471 Supraventricular tachycardia: Secondary | ICD-10-CM

## 2017-12-09 DIAGNOSIS — I10 Essential (primary) hypertension: Secondary | ICD-10-CM | POA: Diagnosis not present

## 2017-12-09 DIAGNOSIS — R000 Tachycardia, unspecified: Secondary | ICD-10-CM | POA: Diagnosis not present

## 2017-12-09 NOTE — Patient Instructions (Signed)

## 2017-12-09 NOTE — Progress Notes (Signed)
SUBJECTIVE: The patient presents for follow-up of coronary artery disease, sinus tachycardia, and paroxysmal SVT. He had a non-STEMI with a very mild troponin elevation 0.08 in the setting of sinus tachycardia in the past. In March 2014, his echocardiogram reportedly revealed normal left ventricular systolic function, diastolic dysfunction, and mild inferobasal hypokinesis. A stress test on 07/31/2012 showed a small area of inferoseptal scar with no evidence of ischemia, and was deemed a low risk study.  He has COPD and right upper lobe adenocarcinoma.  He uses 2 L of oxygen chronically.  ECG performed in the office today which I ordered and personally interpreted demonstrates normal sinus rhythm with small septal Q waves and no arrhythmias.  He denies chest pain and palpitations.  He has not had use nitroglycerin since his last visit with me.  Chronic exertional dyspnea is stable.    Review of Systems: As per "subjective", otherwise negative.  No Known Allergies  Current Outpatient Medications  Medication Sig Dispense Refill  . acetaminophen (TYLENOL 8 HOUR ARTHRITIS PAIN) 650 MG CR tablet Take 650 mg by mouth every 8 (eight) hours as needed for pain.    Marland Kitchen aspirin 81 MG chewable tablet Chew 81 mg by mouth daily.    . budesonide-formoterol (SYMBICORT) 160-4.5 MCG/ACT inhaler Inhale 2 puffs into the lungs 2 (two) times daily.    . carvedilol (COREG) 6.25 MG tablet Take 1 tablet (6.25 mg total) by mouth 2 (two) times daily. 180 tablet 1  . cyproheptadine (PERIACTIN) 4 MG tablet Take 4 mg by mouth 3 (three) times daily.    Marland Kitchen emollient (RADIAGEL) gel Apply topically as needed for wound care.    Marland Kitchen ipratropium-albuterol (DUONEB) 0.5-2.5 (3) MG/3ML SOLN Take 3 mLs by nebulization every 6 (six) hours.     . nitroGLYCERIN (NITROSTAT) 0.4 MG SL tablet Place 1 tablet (0.4 mg total) under the tongue every 5 (five) minutes as needed for chest pain. (Not more than 3 in 15 minute time frame, if no  relief - proceed to ED) 25 tablet 3  . omeprazole (PRILOSEC) 20 MG capsule Take 20 mg by mouth daily.    Donell Sievert IN Inhale into the lungs at bedtime as needed.    . promethazine (PHENERGAN) 25 MG tablet Take 25 mg by mouth every 6 (six) hours as needed for nausea or vomiting.    . simvastatin (ZOCOR) 20 MG tablet Take 20 mg by mouth daily at 6 PM.     . SPIRIVA HANDIHALER 18 MCG inhalation capsule Place 18 mcg into inhaler and inhale daily.     . tamsulosin (FLOMAX) 0.4 MG CAPS capsule Take 0.4 mg by mouth daily after supper.    . theophylline (THEODUR) 300 MG 12 hr tablet Take 300 mg by mouth 2 (two) times daily.    Marland Kitchen venlafaxine XR (EFFEXOR-XR) 150 MG 24 hr capsule Take 150 mg by mouth daily with breakfast.    . VENTOLIN HFA 108 (90 BASE) MCG/ACT inhaler Inhale 2 puffs into the lungs every 6 (six) hours as needed for shortness of breath.     . zolpidem (AMBIEN) 10 MG tablet Take 5 mg by mouth at bedtime as needed for sleep.     No current facility-administered medications for this visit.     Past Medical History:  Diagnosis Date  . COPD (chronic obstructive pulmonary disease) (HCC)    History of tobacco use, O2 - 2 liters per min., uses on & off   . H/O echocardiogram  2013  . Hypertension   . Hypoxic    History of hypoxic respiratory failure  . Lung cancer ([facility]) 2015  . Non-ST elevated myocardial infarction (non-STEMI) ([facility])   . Palpitations   . S/P radiation therapy 10/01/2013, 10/03/2013, 10/05/2013, 10/08/2013, 10/10/2013   Right upper lung / 50 Gy in 5 fractions  . Shortness of breath   . Sinus tachycardia     Past Surgical History:  Procedure Laterality Date  . MULTIPLE EXTRACTIONS WITH ALVEOLOPLASTY N/A 03/20/2013   Procedure: MULTIPLE EXTRACION 8, 9, 11, 13, 14, 15, 16, 18, 19, 20, 21, 23, 24, 25, 26, 27, 28, 29, 30 WITH ALVEOLOPLASTY X 4, BILATERAL TORI;  Surgeon: Gae Bon, DDS;  Location: [facility];  Service: Oral Surgery;  Laterality: N/A;  . none      Social  History   Socioeconomic History  . Marital status: Single    Spouse name: Not on file  . Number of children: Not on file  . Years of education: Not on file  . Highest education level: Not on file  Occupational History  . Not on file  Social Needs  . Financial resource strain: Not on file  . Food insecurity:    Worry: Not on file    Inability: Not on file  . Transportation needs:    Medical: Not on file    Non-medical: Not on file  Tobacco Use  . Smoking status: Former Smoker    Packs/day: 1.00    Years: 30.00    Pack years: 30.00    Types: Cigarettes    Last attempt to quit: 05/24/2006    Years since quitting: 11.5  . Smokeless tobacco: Never Used  Substance and Sexual Activity  . Alcohol use: No    Comment: quit- 2012- since need O2 for breathing   . Drug use: No  . Sexual activity: Not on file  Lifestyle  . Physical activity:    Days per week: Not on file    Minutes per session: Not on file  . Stress: Not on file  Relationships  . Social connections:    Talks on phone: Not on file    Gets together: Not on file    Attends religious service: Not on file    Active member of club or organization: Not on file    Attends meetings of clubs or organizations: Not on file    Relationship status: Not on file  . Intimate partner violence:    Fear of current or ex partner: Not on file    Emotionally abused: Not on file    Physically abused: Not on file    Forced sexual activity: Not on file  Other Topics Concern  . Not on file  Social History Narrative  . Not on file     Vitals:   12/09/17 1119  BP: (!) 124/58  Pulse: 74  SpO2: 96%  Weight: 133 lb (60.3 kg)  Height: 5\' 8"  (1.727 m)    Wt Readings from Last 3 Encounters:  12/09/17 133 lb (60.3 kg)  10/22/16 137 lb (62.1 kg)  10/22/15 133 lb (60.3 kg)     PHYSICAL EXAM General: NAD, using oxygen by nasal cannula. HEENT: Normal. Neck: No JVD, no thyromegaly. Lungs: Diminished sounds throughout, no wheezes  or rales. CV: Distant heart tones.  Regular rate and rhythm, normal S1/S2, no S3/S4, no murmur. No pretibial or periankle edema.  No carotid bruit.   Abdomen: Soft, nontender, no distention.  Neurologic: Alert and  oriented.  Psych: Normal affect. Skin: Normal. Musculoskeletal: No gross deformities.    ECG: Reviewed above under Subjective   Labs: Lab Results  Component Value Date/Time   K 4.0 03/15/2013 12:58 PM   BUN 7.9 01/18/2014 11:22 AM   CREATININE 0.7 01/18/2014 11:22 AM   HGB 15.5 03/15/2013 12:58 PM     Lipids: No results found for: LDLCALC, LDLDIRECT, CHOL, TRIG, HDL     ASSESSMENT AND PLAN: 1.  Coronary disease with a history of non-STEMI versus demand ischemia: Symptomatically stable.  Continue aspirin, carvedilol, and simvastatin.  Left ventricular systolic function was normal.  He underwent a low risk nuclear stress test in March 2014.  No testing is indicated at this time.  2.  Hypertension: Controlled on present therapy.  No changes.  3.  SVT and sinus tachycardia/palpitations: Symptomatically stable on carvedilol.  No changes.    Disposition: Follow up 1 year.   Kate Sable, M.D., F.A.C.C.

## 2017-12-13 DIAGNOSIS — Z6820 Body mass index (BMI) 20.0-20.9, adult: Secondary | ICD-10-CM | POA: Diagnosis not present

## 2017-12-13 DIAGNOSIS — Z0000 Encounter for general adult medical examination without abnormal findings: Secondary | ICD-10-CM | POA: Diagnosis not present

## 2017-12-29 DIAGNOSIS — J449 Chronic obstructive pulmonary disease, unspecified: Secondary | ICD-10-CM | POA: Diagnosis not present

## 2018-01-20 ENCOUNTER — Ambulatory Visit: Payer: Medicare HMO | Admitting: Cardiovascular Disease

## 2018-01-29 DIAGNOSIS — J449 Chronic obstructive pulmonary disease, unspecified: Secondary | ICD-10-CM | POA: Diagnosis not present

## 2018-02-28 DIAGNOSIS — J449 Chronic obstructive pulmonary disease, unspecified: Secondary | ICD-10-CM | POA: Diagnosis not present

## 2018-03-08 DIAGNOSIS — C3411 Malignant neoplasm of upper lobe, right bronchus or lung: Secondary | ICD-10-CM | POA: Diagnosis not present

## 2018-03-13 DIAGNOSIS — Z1159 Encounter for screening for other viral diseases: Secondary | ICD-10-CM | POA: Diagnosis not present

## 2018-03-13 DIAGNOSIS — C3491 Malignant neoplasm of unspecified part of right bronchus or lung: Secondary | ICD-10-CM | POA: Diagnosis not present

## 2018-03-13 DIAGNOSIS — Z923 Personal history of irradiation: Secondary | ICD-10-CM | POA: Diagnosis not present

## 2018-03-13 DIAGNOSIS — I700 Atherosclerosis of aorta: Secondary | ICD-10-CM | POA: Diagnosis not present

## 2018-03-13 DIAGNOSIS — Z6820 Body mass index (BMI) 20.0-20.9, adult: Secondary | ICD-10-CM | POA: Diagnosis not present

## 2018-03-13 DIAGNOSIS — Z0000 Encounter for general adult medical examination without abnormal findings: Secondary | ICD-10-CM | POA: Diagnosis not present

## 2018-03-13 DIAGNOSIS — I2510 Atherosclerotic heart disease of native coronary artery without angina pectoris: Secondary | ICD-10-CM | POA: Diagnosis not present

## 2018-03-13 DIAGNOSIS — J439 Emphysema, unspecified: Secondary | ICD-10-CM | POA: Diagnosis not present

## 2018-03-13 DIAGNOSIS — J449 Chronic obstructive pulmonary disease, unspecified: Secondary | ICD-10-CM | POA: Diagnosis not present

## 2018-03-13 DIAGNOSIS — M810 Age-related osteoporosis without current pathological fracture: Secondary | ICD-10-CM | POA: Diagnosis not present

## 2018-03-13 DIAGNOSIS — C3411 Malignant neoplasm of upper lobe, right bronchus or lung: Secondary | ICD-10-CM | POA: Diagnosis not present

## 2018-03-13 DIAGNOSIS — N400 Enlarged prostate without lower urinary tract symptoms: Secondary | ICD-10-CM | POA: Diagnosis not present

## 2018-03-13 DIAGNOSIS — R918 Other nonspecific abnormal finding of lung field: Secondary | ICD-10-CM | POA: Diagnosis not present

## 2018-03-13 DIAGNOSIS — E440 Moderate protein-calorie malnutrition: Secondary | ICD-10-CM | POA: Diagnosis not present

## 2018-03-13 DIAGNOSIS — Z5111 Encounter for antineoplastic chemotherapy: Secondary | ICD-10-CM | POA: Diagnosis not present

## 2018-03-17 ENCOUNTER — Other Ambulatory Visit: Payer: Self-pay | Admitting: Cardiovascular Disease

## 2018-03-31 DIAGNOSIS — J449 Chronic obstructive pulmonary disease, unspecified: Secondary | ICD-10-CM | POA: Diagnosis not present

## 2018-04-30 DIAGNOSIS — J449 Chronic obstructive pulmonary disease, unspecified: Secondary | ICD-10-CM | POA: Diagnosis not present

## 2018-05-26 DIAGNOSIS — R0989 Other specified symptoms and signs involving the circulatory and respiratory systems: Secondary | ICD-10-CM | POA: Diagnosis not present

## 2018-05-26 DIAGNOSIS — I10 Essential (primary) hypertension: Secondary | ICD-10-CM | POA: Diagnosis not present

## 2018-05-26 DIAGNOSIS — F419 Anxiety disorder, unspecified: Secondary | ICD-10-CM | POA: Diagnosis not present

## 2018-05-26 DIAGNOSIS — J441 Chronic obstructive pulmonary disease with (acute) exacerbation: Secondary | ICD-10-CM | POA: Diagnosis not present

## 2018-05-26 DIAGNOSIS — Z87891 Personal history of nicotine dependence: Secondary | ICD-10-CM | POA: Diagnosis not present

## 2018-05-26 DIAGNOSIS — Z79899 Other long term (current) drug therapy: Secondary | ICD-10-CM | POA: Diagnosis not present

## 2018-05-26 DIAGNOSIS — R0602 Shortness of breath: Secondary | ICD-10-CM | POA: Diagnosis not present

## 2018-05-31 DIAGNOSIS — J449 Chronic obstructive pulmonary disease, unspecified: Secondary | ICD-10-CM | POA: Diagnosis not present

## 2018-06-14 DIAGNOSIS — N400 Enlarged prostate without lower urinary tract symptoms: Secondary | ICD-10-CM | POA: Diagnosis not present

## 2018-06-14 DIAGNOSIS — Z6820 Body mass index (BMI) 20.0-20.9, adult: Secondary | ICD-10-CM | POA: Diagnosis not present

## 2018-06-14 DIAGNOSIS — J449 Chronic obstructive pulmonary disease, unspecified: Secondary | ICD-10-CM | POA: Diagnosis not present

## 2018-06-14 DIAGNOSIS — M810 Age-related osteoporosis without current pathological fracture: Secondary | ICD-10-CM | POA: Diagnosis not present

## 2018-06-14 DIAGNOSIS — E440 Moderate protein-calorie malnutrition: Secondary | ICD-10-CM | POA: Diagnosis not present

## 2018-06-24 DIAGNOSIS — J449 Chronic obstructive pulmonary disease, unspecified: Secondary | ICD-10-CM | POA: Diagnosis not present

## 2018-07-01 DIAGNOSIS — J449 Chronic obstructive pulmonary disease, unspecified: Secondary | ICD-10-CM | POA: Diagnosis not present

## 2018-07-30 DIAGNOSIS — J449 Chronic obstructive pulmonary disease, unspecified: Secondary | ICD-10-CM | POA: Diagnosis not present

## 2018-08-30 DIAGNOSIS — J449 Chronic obstructive pulmonary disease, unspecified: Secondary | ICD-10-CM | POA: Diagnosis not present

## 2018-09-14 ENCOUNTER — Other Ambulatory Visit: Payer: Self-pay | Admitting: Cardiovascular Disease

## 2018-09-19 DIAGNOSIS — E440 Moderate protein-calorie malnutrition: Secondary | ICD-10-CM | POA: Diagnosis not present

## 2018-09-19 DIAGNOSIS — Z125 Encounter for screening for malignant neoplasm of prostate: Secondary | ICD-10-CM | POA: Diagnosis not present

## 2018-09-19 DIAGNOSIS — N400 Enlarged prostate without lower urinary tract symptoms: Secondary | ICD-10-CM | POA: Diagnosis not present

## 2018-09-19 DIAGNOSIS — Z681 Body mass index (BMI) 19 or less, adult: Secondary | ICD-10-CM | POA: Diagnosis not present

## 2018-09-19 DIAGNOSIS — Z0000 Encounter for general adult medical examination without abnormal findings: Secondary | ICD-10-CM | POA: Diagnosis not present

## 2018-09-19 DIAGNOSIS — J449 Chronic obstructive pulmonary disease, unspecified: Secondary | ICD-10-CM | POA: Diagnosis not present

## 2018-09-19 DIAGNOSIS — M810 Age-related osteoporosis without current pathological fracture: Secondary | ICD-10-CM | POA: Diagnosis not present

## 2018-09-19 DIAGNOSIS — Z1389 Encounter for screening for other disorder: Secondary | ICD-10-CM | POA: Diagnosis not present

## 2018-10-14 DIAGNOSIS — E7800 Pure hypercholesterolemia, unspecified: Secondary | ICD-10-CM | POA: Diagnosis not present

## 2018-10-14 DIAGNOSIS — Z87891 Personal history of nicotine dependence: Secondary | ICD-10-CM | POA: Diagnosis not present

## 2018-10-14 DIAGNOSIS — Z79899 Other long term (current) drug therapy: Secondary | ICD-10-CM | POA: Diagnosis not present

## 2018-10-14 DIAGNOSIS — R0602 Shortness of breath: Secondary | ICD-10-CM | POA: Diagnosis not present

## 2018-10-14 DIAGNOSIS — Z9981 Dependence on supplemental oxygen: Secondary | ICD-10-CM | POA: Diagnosis not present

## 2018-10-14 DIAGNOSIS — Z7982 Long term (current) use of aspirin: Secondary | ICD-10-CM | POA: Diagnosis not present

## 2018-10-14 DIAGNOSIS — J449 Chronic obstructive pulmonary disease, unspecified: Secondary | ICD-10-CM | POA: Diagnosis not present

## 2018-10-14 DIAGNOSIS — Z85118 Personal history of other malignant neoplasm of bronchus and lung: Secondary | ICD-10-CM | POA: Diagnosis not present

## 2018-10-14 DIAGNOSIS — Z923 Personal history of irradiation: Secondary | ICD-10-CM | POA: Diagnosis not present

## 2018-11-03 ENCOUNTER — Ambulatory Visit (INDEPENDENT_AMBULATORY_CARE_PROVIDER_SITE_OTHER): Payer: Medicare HMO | Admitting: Urology

## 2018-11-03 DIAGNOSIS — R3914 Feeling of incomplete bladder emptying: Secondary | ICD-10-CM

## 2018-11-03 DIAGNOSIS — N401 Enlarged prostate with lower urinary tract symptoms: Secondary | ICD-10-CM

## 2018-11-03 DIAGNOSIS — R972 Elevated prostate specific antigen [PSA]: Secondary | ICD-10-CM | POA: Diagnosis not present

## 2018-11-03 DIAGNOSIS — R3912 Poor urinary stream: Secondary | ICD-10-CM | POA: Diagnosis not present

## 2018-12-08 ENCOUNTER — Ambulatory Visit: Payer: Medicare HMO | Admitting: Urology

## 2018-12-08 ENCOUNTER — Other Ambulatory Visit: Payer: Self-pay

## 2018-12-19 ENCOUNTER — Other Ambulatory Visit: Payer: Self-pay | Admitting: Cardiovascular Disease

## 2018-12-20 DIAGNOSIS — J449 Chronic obstructive pulmonary disease, unspecified: Secondary | ICD-10-CM | POA: Diagnosis not present

## 2018-12-20 DIAGNOSIS — Z0000 Encounter for general adult medical examination without abnormal findings: Secondary | ICD-10-CM | POA: Diagnosis not present

## 2018-12-20 DIAGNOSIS — N400 Enlarged prostate without lower urinary tract symptoms: Secondary | ICD-10-CM | POA: Diagnosis not present

## 2018-12-20 DIAGNOSIS — Z681 Body mass index (BMI) 19 or less, adult: Secondary | ICD-10-CM | POA: Diagnosis not present

## 2018-12-20 DIAGNOSIS — E785 Hyperlipidemia, unspecified: Secondary | ICD-10-CM | POA: Diagnosis not present

## 2018-12-20 DIAGNOSIS — E440 Moderate protein-calorie malnutrition: Secondary | ICD-10-CM | POA: Diagnosis not present

## 2018-12-20 DIAGNOSIS — M810 Age-related osteoporosis without current pathological fracture: Secondary | ICD-10-CM | POA: Diagnosis not present

## 2018-12-29 DIAGNOSIS — Z1329 Encounter for screening for other suspected endocrine disorder: Secondary | ICD-10-CM | POA: Diagnosis not present

## 2018-12-31 DIAGNOSIS — E7800 Pure hypercholesterolemia, unspecified: Secondary | ICD-10-CM | POA: Diagnosis not present

## 2018-12-31 DIAGNOSIS — Z79899 Other long term (current) drug therapy: Secondary | ICD-10-CM | POA: Diagnosis not present

## 2018-12-31 DIAGNOSIS — K5730 Diverticulosis of large intestine without perforation or abscess without bleeding: Secondary | ICD-10-CM | POA: Diagnosis not present

## 2018-12-31 DIAGNOSIS — R0602 Shortness of breath: Secondary | ICD-10-CM | POA: Diagnosis not present

## 2018-12-31 DIAGNOSIS — J449 Chronic obstructive pulmonary disease, unspecified: Secondary | ICD-10-CM | POA: Diagnosis not present

## 2018-12-31 DIAGNOSIS — Z87891 Personal history of nicotine dependence: Secondary | ICD-10-CM | POA: Diagnosis not present

## 2018-12-31 DIAGNOSIS — Z85118 Personal history of other malignant neoplasm of bronchus and lung: Secondary | ICD-10-CM | POA: Diagnosis not present

## 2018-12-31 DIAGNOSIS — K5900 Constipation, unspecified: Secondary | ICD-10-CM | POA: Diagnosis not present

## 2018-12-31 DIAGNOSIS — R109 Unspecified abdominal pain: Secondary | ICD-10-CM | POA: Diagnosis not present

## 2019-02-08 DIAGNOSIS — Z87891 Personal history of nicotine dependence: Secondary | ICD-10-CM | POA: Diagnosis not present

## 2019-02-08 DIAGNOSIS — Z85118 Personal history of other malignant neoplasm of bronchus and lung: Secondary | ICD-10-CM | POA: Diagnosis not present

## 2019-02-08 DIAGNOSIS — R911 Solitary pulmonary nodule: Secondary | ICD-10-CM | POA: Diagnosis not present

## 2019-02-08 DIAGNOSIS — Z20828 Contact with and (suspected) exposure to other viral communicable diseases: Secondary | ICD-10-CM | POA: Diagnosis not present

## 2019-02-08 DIAGNOSIS — I700 Atherosclerosis of aorta: Secondary | ICD-10-CM | POA: Diagnosis not present

## 2019-02-08 DIAGNOSIS — J449 Chronic obstructive pulmonary disease, unspecified: Secondary | ICD-10-CM | POA: Diagnosis not present

## 2019-02-08 DIAGNOSIS — Z79899 Other long term (current) drug therapy: Secondary | ICD-10-CM | POA: Diagnosis not present

## 2019-02-08 DIAGNOSIS — E7800 Pure hypercholesterolemia, unspecified: Secondary | ICD-10-CM | POA: Diagnosis not present

## 2019-02-08 DIAGNOSIS — I10 Essential (primary) hypertension: Secondary | ICD-10-CM | POA: Diagnosis not present

## 2019-02-08 DIAGNOSIS — R0602 Shortness of breath: Secondary | ICD-10-CM | POA: Diagnosis not present

## 2019-02-21 ENCOUNTER — Ambulatory Visit (INDEPENDENT_AMBULATORY_CARE_PROVIDER_SITE_OTHER): Payer: Medicare HMO | Admitting: Cardiovascular Disease

## 2019-02-21 ENCOUNTER — Encounter: Payer: Self-pay | Admitting: *Deleted

## 2019-02-21 ENCOUNTER — Encounter: Payer: Self-pay | Admitting: Cardiovascular Disease

## 2019-02-21 ENCOUNTER — Other Ambulatory Visit: Payer: Self-pay

## 2019-02-21 VITALS — BP 118/58 | HR 91 | Ht 68.0 in | Wt 129.0 lb

## 2019-02-21 DIAGNOSIS — J449 Chronic obstructive pulmonary disease, unspecified: Secondary | ICD-10-CM | POA: Diagnosis not present

## 2019-02-21 DIAGNOSIS — I10 Essential (primary) hypertension: Secondary | ICD-10-CM

## 2019-02-21 DIAGNOSIS — I25118 Atherosclerotic heart disease of native coronary artery with other forms of angina pectoris: Secondary | ICD-10-CM

## 2019-02-21 DIAGNOSIS — I471 Supraventricular tachycardia: Secondary | ICD-10-CM | POA: Diagnosis not present

## 2019-02-21 DIAGNOSIS — R000 Tachycardia, unspecified: Secondary | ICD-10-CM | POA: Diagnosis not present

## 2019-02-21 NOTE — Progress Notes (Signed)
SUBJECTIVE: The patient presents for follow-up of coronary artery disease, sinus tachycardia, and paroxysmal SVT. He had a non-STEMI with a very mild troponin elevation 0.08 in the setting of sinus tachycardia in the past. In 2012-08-15, his echocardiogram reportedly revealed normal left ventricular systolic function, diastolic dysfunction, and mild inferobasal hypokinesis. A stress test on 2012/08/15 showed a small area of inferoseptal scar with no evidence of ischemia, and was deemed a low risk study.  He has COPD and right upper lobe adenocarcinoma.  He uses 2 L of oxygen chronically.  He denies chest pain and palpitations.  Chronic exertional dyspnea is stable.  He lives with his 6-year-old sister.  Social history: He is 1 of 14 children.  There were originally 7 brothers and 7 sisters.  All 6 of his brothers have since passed away.  5 of his sisters are still living.  He lives with his 26 year old sister.  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  He has 1 adult son who still lives in [place]. He smoked 2 packs of cigarettes daily for several years and quit in 08/15/08.  His wife passed away in [place] in 16-Aug-2006.  Review of Systems: As per "subjective", otherwise negative.  No Known Allergies  Current Outpatient Medications  Medication Sig Dispense Refill  . acetaminophen (TYLENOL 8 HOUR ARTHRITIS PAIN) 650 MG CR tablet Take 650 mg by mouth every 8 (eight) hours as needed for pain.    Marland Kitchen aspirin 81 MG chewable tablet Chew 81 mg by mouth daily.    . budesonide-formoterol (SYMBICORT) 160-4.5 MCG/ACT inhaler Inhale 2 puffs into the lungs 2 (two) times daily.    . carvedilol (COREG) 6.25 MG tablet Take 1 tablet by mouth twice daily 180 tablet 0  . cyproheptadine (PERIACTIN) 4 MG tablet Take 4 mg by mouth 3 (three) times daily.    Marland Kitchen emollient (RADIAGEL) gel Apply topically as needed for wound care.    Marland Kitchen ipratropium-albuterol (DUONEB)  0.5-2.5 (3) MG/3ML SOLN Take 3 mLs by nebulization every 6 (six) hours.     . nitroGLYCERIN (NITROSTAT) 0.4 MG SL tablet Place 1 tablet (0.4 mg total) under the tongue every 5 (five) minutes as needed for chest pain. (Not more than 3 in 15 minute time frame, if no relief - proceed to ED) 25 tablet 3  . omeprazole (PRILOSEC) 20 MG capsule Take 20 mg by mouth daily.    Donell Sievert IN Inhale into the lungs at bedtime as needed.    . promethazine (PHENERGAN) 25 MG tablet Take 25 mg by mouth every 6 (six) hours as needed for nausea or vomiting.    . simvastatin (ZOCOR) 20 MG tablet Take 20 mg by mouth daily at 6 PM.     . SPIRIVA HANDIHALER 18 MCG inhalation capsule Place 18 mcg into inhaler and inhale daily.     . tamsulosin (FLOMAX) 0.4 MG CAPS capsule Take 0.4 mg by mouth daily after supper.    . theophylline (THEODUR) 300 MG 12 hr tablet Take 300 mg by mouth 2 (two) times daily.    Marland Kitchen venlafaxine XR (EFFEXOR-XR) 150 MG 24 hr capsule Take 150 mg by mouth daily with breakfast.    . VENTOLIN HFA 108 (90 BASE) MCG/ACT inhaler Inhale 2 puffs into the lungs every 6 (six) hours as needed for shortness of breath.     . zolpidem (AMBIEN) 10 MG tablet Take 5 mg by mouth at  bedtime as needed for sleep.     No current facility-administered medications for this visit.     Past Medical History:  Diagnosis Date  . COPD (chronic obstructive pulmonary disease) ([facility])    History of tobacco use, O2 - 2 liters per min., uses on & off   . H/O echocardiogram 2013  . Hypertension   . Hypoxic    History of hypoxic respiratory failure  . Lung cancer ([facility]) 2015  . Non-ST elevated myocardial infarction (non-STEMI) ([facility])   . Palpitations   . S/P radiation therapy 10/01/2013, 10/03/2013, 10/05/2013, 10/08/2013, 10/10/2013   Right upper lung / 50 Gy in 5 fractions  . Shortness of breath   . Sinus tachycardia     Past Surgical History:  Procedure Laterality Date  . MULTIPLE EXTRACTIONS WITH ALVEOLOPLASTY N/A  03/20/2013   Procedure: MULTIPLE EXTRACION 8, 9, 11, 13, 14, 15, 16, 18, 19, 20, 21, 23, 24, 25, 26, 27, 28, 29, 30 WITH ALVEOLOPLASTY X 4, BILATERAL TORI;  Surgeon: Gae Bon, DDS;  Location: [facility];  Service: Oral Surgery;  Laterality: N/A;  . none      Social History   Socioeconomic History  . Marital status: Single    Spouse name: Not on file  . Number of children: Not on file  . Years of education: Not on file  . Highest education level: Not on file  Occupational History  . Not on file  Social Needs  . Financial resource strain: Not on file  . Food insecurity    Worry: Not on file    Inability: Not on file  . Transportation needs    Medical: Not on file    Non-medical: Not on file  Tobacco Use  . Smoking status: Former Smoker    Packs/day: 1.00    Years: 30.00    Pack years: 30.00    Types: Cigarettes    Quit date: 05/24/2006    Years since quitting: 12.7  . Smokeless tobacco: Never Used  Substance and Sexual Activity  . Alcohol use: No    Comment: quit- 2012- since need O2 for breathing   . Drug use: No  . Sexual activity: Not on file  Lifestyle  . Physical activity    Days per week: Not on file    Minutes per session: Not on file  . Stress: Not on file  Relationships  . Social Herbalist on phone: Not on file    Gets together: Not on file    Attends religious service: Not on file    Active member of club or organization: Not on file    Attends meetings of clubs or organizations: Not on file    Relationship status: Not on file  . Intimate partner violence    Fear of current or ex partner: Not on file    Emotionally abused: Not on file    Physically abused: Not on file    Forced sexual activity: Not on file  Other Topics Concern  . Not on file  Social History Narrative  . Not on file     Vitals:   02/21/19 1425  BP: (!) 118/58  Pulse: 91  SpO2: 99%  Weight: 129 lb (58.5 kg)  Height: 5\' 8"  (1.727 m)    Wt Readings from Last 3  Encounters:  02/21/19 129 lb (58.5 kg)  12/09/17 133 lb (60.3 kg)  10/22/16 137 lb (62.1 kg)     PHYSICAL EXAM General: NAD HEENT:  Normal. Neck: No JVD, no thyromegaly. Lungs: Diffusely diminished breath sounds, no crackles or wheezes. CV: Regular rate and rhythm, normal S1/S2, no S3/S4, no murmur. No pretibial or periankle edema.  No carotid bruit.   Abdomen: Soft, nontender, no distention.  Neurologic: Alert and oriented.  Psych: Normal affect. Skin: Normal. Musculoskeletal: No gross deformities.      Labs: Lab Results  Component Value Date/Time   K 4.0 03/15/2013 12:58 PM   BUN 7.9 01/18/2014 11:22 AM   CREATININE 0.7 01/18/2014 11:22 AM   HGB 15.5 03/15/2013 12:58 PM     Lipids: No results found for: LDLCALC, LDLDIRECT, CHOL, TRIG, HDL     ASSESSMENT AND PLAN:  1.  Coronary disease with a history of non-STEMI versus demand ischemia: Symptomatically stable.  Continue aspirin, carvedilol, and simvastatin.  Left ventricular systolic function was normal.  He underwent a low risk nuclear stress test in March 2014.  No cardiac testing is indicated at this time.  2.  Hypertension: Controlled on present therapy.  No changes.  3.  SVT and sinus tachycardia/palpitations: Symptomatically stable on carvedilol.  No changes.  4.  COPD: Symptoms appear to be stable.  He quit smoking 2 packs of cigarettes daily back in 2010.  He uses 2 L of oxygen chronically.   Disposition: Follow up 1 yr.   Kate Sable, M.D., F.A.C.C.

## 2019-02-21 NOTE — Patient Instructions (Signed)

## 2019-03-12 DIAGNOSIS — J449 Chronic obstructive pulmonary disease, unspecified: Secondary | ICD-10-CM | POA: Diagnosis not present

## 2019-03-12 DIAGNOSIS — F411 Generalized anxiety disorder: Secondary | ICD-10-CM | POA: Diagnosis not present

## 2019-03-12 DIAGNOSIS — Z681 Body mass index (BMI) 19 or less, adult: Secondary | ICD-10-CM | POA: Diagnosis not present

## 2019-03-20 ENCOUNTER — Other Ambulatory Visit: Payer: Self-pay | Admitting: Cardiovascular Disease

## 2019-03-23 ENCOUNTER — Ambulatory Visit (INDEPENDENT_AMBULATORY_CARE_PROVIDER_SITE_OTHER): Payer: Medicare HMO | Admitting: Urology

## 2019-03-23 ENCOUNTER — Other Ambulatory Visit: Payer: Self-pay

## 2019-03-23 DIAGNOSIS — N401 Enlarged prostate with lower urinary tract symptoms: Secondary | ICD-10-CM | POA: Diagnosis not present

## 2019-03-23 DIAGNOSIS — R3914 Feeling of incomplete bladder emptying: Secondary | ICD-10-CM

## 2019-03-23 DIAGNOSIS — R972 Elevated prostate specific antigen [PSA]: Secondary | ICD-10-CM

## 2019-04-04 DIAGNOSIS — Z681 Body mass index (BMI) 19 or less, adult: Secondary | ICD-10-CM | POA: Diagnosis not present

## 2019-04-04 DIAGNOSIS — F411 Generalized anxiety disorder: Secondary | ICD-10-CM | POA: Diagnosis not present

## 2019-04-04 DIAGNOSIS — J441 Chronic obstructive pulmonary disease with (acute) exacerbation: Secondary | ICD-10-CM | POA: Diagnosis not present

## 2019-05-02 DIAGNOSIS — Z681 Body mass index (BMI) 19 or less, adult: Secondary | ICD-10-CM | POA: Diagnosis not present

## 2019-05-02 DIAGNOSIS — J449 Chronic obstructive pulmonary disease, unspecified: Secondary | ICD-10-CM | POA: Diagnosis not present

## 2019-08-07 DIAGNOSIS — C3411 Malignant neoplasm of upper lobe, right bronchus or lung: Secondary | ICD-10-CM | POA: Diagnosis not present

## 2019-08-13 DIAGNOSIS — I700 Atherosclerosis of aorta: Secondary | ICD-10-CM | POA: Diagnosis not present

## 2019-08-13 DIAGNOSIS — C3411 Malignant neoplasm of upper lobe, right bronchus or lung: Secondary | ICD-10-CM | POA: Diagnosis not present

## 2019-08-13 DIAGNOSIS — J439 Emphysema, unspecified: Secondary | ICD-10-CM | POA: Diagnosis not present

## 2019-08-15 ENCOUNTER — Other Ambulatory Visit (HOSPITAL_COMMUNITY): Payer: Self-pay | Admitting: Radiation Oncology

## 2019-08-15 DIAGNOSIS — C3411 Malignant neoplasm of upper lobe, right bronchus or lung: Secondary | ICD-10-CM

## 2019-08-16 DIAGNOSIS — J449 Chronic obstructive pulmonary disease, unspecified: Secondary | ICD-10-CM | POA: Diagnosis not present

## 2019-08-16 DIAGNOSIS — R630 Anorexia: Secondary | ICD-10-CM | POA: Diagnosis not present

## 2019-08-16 DIAGNOSIS — Z681 Body mass index (BMI) 19 or less, adult: Secondary | ICD-10-CM | POA: Diagnosis not present

## 2019-08-20 ENCOUNTER — Other Ambulatory Visit: Payer: Self-pay

## 2019-08-20 ENCOUNTER — Encounter (HOSPITAL_COMMUNITY)
Admission: RE | Admit: 2019-08-20 | Discharge: 2019-08-20 | Disposition: A | Discharge status: Home / Self Care | Payer: Medicare HMO | Source: Ambulatory Visit | Attending: Radiation Oncology | Admitting: Radiation Oncology

## 2019-08-20 DIAGNOSIS — Z923 Personal history of irradiation: Secondary | ICD-10-CM | POA: Diagnosis not present

## 2019-08-20 DIAGNOSIS — J449 Chronic obstructive pulmonary disease, unspecified: Secondary | ICD-10-CM | POA: Diagnosis not present

## 2019-08-20 DIAGNOSIS — I10 Essential (primary) hypertension: Secondary | ICD-10-CM | POA: Diagnosis not present

## 2019-08-20 DIAGNOSIS — I6523 Occlusion and stenosis of bilateral carotid arteries: Secondary | ICD-10-CM | POA: Diagnosis not present

## 2019-08-20 DIAGNOSIS — C3490 Malignant neoplasm of unspecified part of unspecified bronchus or lung: Secondary | ICD-10-CM | POA: Diagnosis not present

## 2019-08-20 DIAGNOSIS — Z550 Illiteracy and low-level literacy: Secondary | ICD-10-CM | POA: Diagnosis not present

## 2019-08-20 DIAGNOSIS — C3411 Malignant neoplasm of upper lobe, right bronchus or lung: Secondary | ICD-10-CM | POA: Insufficient documentation

## 2019-08-20 DIAGNOSIS — Z1329 Encounter for screening for other suspected endocrine disorder: Secondary | ICD-10-CM | POA: Diagnosis not present

## 2019-08-20 DIAGNOSIS — C7951 Secondary malignant neoplasm of bone: Secondary | ICD-10-CM | POA: Diagnosis not present

## 2019-08-20 MED ORDER — FLUDEOXYGLUCOSE F - 18 (FDG) INJECTION
7.2600 | Freq: Once | INTRAVENOUS | Status: AC | PRN
  Administered 2019-08-20: 7.26 via INTRAVENOUS

## 2019-08-22 DIAGNOSIS — C3411 Malignant neoplasm of upper lobe, right bronchus or lung: Secondary | ICD-10-CM | POA: Diagnosis not present

## 2019-08-22 DIAGNOSIS — C3490 Malignant neoplasm of unspecified part of unspecified bronchus or lung: Secondary | ICD-10-CM | POA: Diagnosis not present

## 2019-08-25 DIAGNOSIS — R0602 Shortness of breath: Secondary | ICD-10-CM | POA: Diagnosis not present

## 2019-08-25 DIAGNOSIS — Z681 Body mass index (BMI) 19 or less, adult: Secondary | ICD-10-CM | POA: Diagnosis not present

## 2019-08-25 DIAGNOSIS — R0689 Other abnormalities of breathing: Secondary | ICD-10-CM | POA: Diagnosis not present

## 2019-08-25 DIAGNOSIS — Z515 Encounter for palliative care: Secondary | ICD-10-CM | POA: Diagnosis not present

## 2019-08-25 DIAGNOSIS — Z87891 Personal history of nicotine dependence: Secondary | ICD-10-CM | POA: Diagnosis not present

## 2019-08-25 DIAGNOSIS — R062 Wheezing: Secondary | ICD-10-CM | POA: Diagnosis not present

## 2019-08-25 DIAGNOSIS — J441 Chronic obstructive pulmonary disease with (acute) exacerbation: Secondary | ICD-10-CM | POA: Diagnosis not present

## 2019-08-25 DIAGNOSIS — I10 Essential (primary) hypertension: Secondary | ICD-10-CM | POA: Diagnosis not present

## 2019-08-25 DIAGNOSIS — Z20822 Contact with and (suspected) exposure to covid-19: Secondary | ICD-10-CM | POA: Diagnosis not present

## 2019-08-25 DIAGNOSIS — R069 Unspecified abnormalities of breathing: Secondary | ICD-10-CM | POA: Diagnosis not present

## 2019-08-25 DIAGNOSIS — I482 Chronic atrial fibrillation, unspecified: Secondary | ICD-10-CM | POA: Diagnosis not present

## 2019-08-25 DIAGNOSIS — I468 Cardiac arrest due to other underlying condition: Secondary | ICD-10-CM | POA: Diagnosis not present

## 2019-08-25 DIAGNOSIS — J9602 Acute respiratory failure with hypercapnia: Secondary | ICD-10-CM | POA: Diagnosis not present

## 2019-08-25 DIAGNOSIS — E440 Moderate protein-calorie malnutrition: Secondary | ICD-10-CM | POA: Diagnosis not present

## 2019-09-22 DEATH — deceased

## 2019-12-31 ENCOUNTER — Ambulatory Visit (INDEPENDENT_AMBULATORY_CARE_PROVIDER_SITE_OTHER): Payer: Medicare HMO | Admitting: Gastroenterology
